# Patient Record
Sex: Male | Born: 1960 | Hispanic: No | Marital: Married | Smoking: Former smoker
Health system: Southern US, Community
[De-identification: ages and names within clinical notes are randomized; demographics above are authoritative.]

## PROBLEM LIST (undated history)

## (undated) DIAGNOSIS — R3912 Poor urinary stream: Secondary | ICD-10-CM

## (undated) DIAGNOSIS — R7302 Impaired glucose tolerance (oral): Secondary | ICD-10-CM

## (undated) DIAGNOSIS — R972 Elevated prostate specific antigen [PSA]: Secondary | ICD-10-CM

## (undated) DIAGNOSIS — N400 Enlarged prostate without lower urinary tract symptoms: Secondary | ICD-10-CM

## (undated) DIAGNOSIS — C61 Malignant neoplasm of prostate: Secondary | ICD-10-CM

## (undated) DIAGNOSIS — N50812 Left testicular pain: Secondary | ICD-10-CM

## (undated) DIAGNOSIS — E291 Testicular hypofunction: Secondary | ICD-10-CM

## (undated) DIAGNOSIS — R17 Unspecified jaundice: Secondary | ICD-10-CM

## (undated) DIAGNOSIS — N529 Male erectile dysfunction, unspecified: Secondary | ICD-10-CM

## (undated) HISTORY — PX: CHOLECYSTECTOMY: SHX55

## (undated) HISTORY — PX: OTHER SURGICAL HISTORY: SHX169

## (undated) HISTORY — DX: Malignant neoplasm of prostate: C61

## (undated) HISTORY — DX: Benign prostatic hyperplasia without lower urinary tract symptoms: N40.0

## (undated) HISTORY — DX: Testicular hypofunction: E29.1

## (undated) HISTORY — DX: Male erectile dysfunction, unspecified: N52.9

## (undated) HISTORY — DX: Left testicular pain: N50.812

## (undated) HISTORY — DX: Poor urinary stream: R39.12

---

## 2000-09-20 HISTORY — PX: GALLBLADDER SURGERY: SHX652

## 2003-04-17 ENCOUNTER — Encounter: Payer: Self-pay | Admitting: Neurosurgery

## 2003-04-17 ENCOUNTER — Ambulatory Visit (HOSPITAL_COMMUNITY): Admission: RE | Admit: 2003-04-17 | Discharge: 2003-04-18 | Payer: Self-pay | Admitting: Neurosurgery

## 2004-09-20 HISTORY — PX: BACK SURGERY: SHX140

## 2004-11-10 ENCOUNTER — Emergency Department: Payer: Self-pay | Admitting: Emergency Medicine

## 2004-11-12 ENCOUNTER — Emergency Department: Payer: Self-pay | Admitting: Emergency Medicine

## 2004-12-30 ENCOUNTER — Other Ambulatory Visit: Payer: Self-pay

## 2004-12-30 ENCOUNTER — Emergency Department: Payer: Self-pay | Admitting: Emergency Medicine

## 2004-12-30 IMAGING — CR DG ABDOMEN 3V
1 series · 4 of 4 positions shown · non-contrast
Comparison: none

REASON FOR EXAM: abd pain  pt in rm 1
COMMENTS:

[Series 1: view not recorded · 0.17mm/px · 4 of 4 slices shown]
[im 1/4]
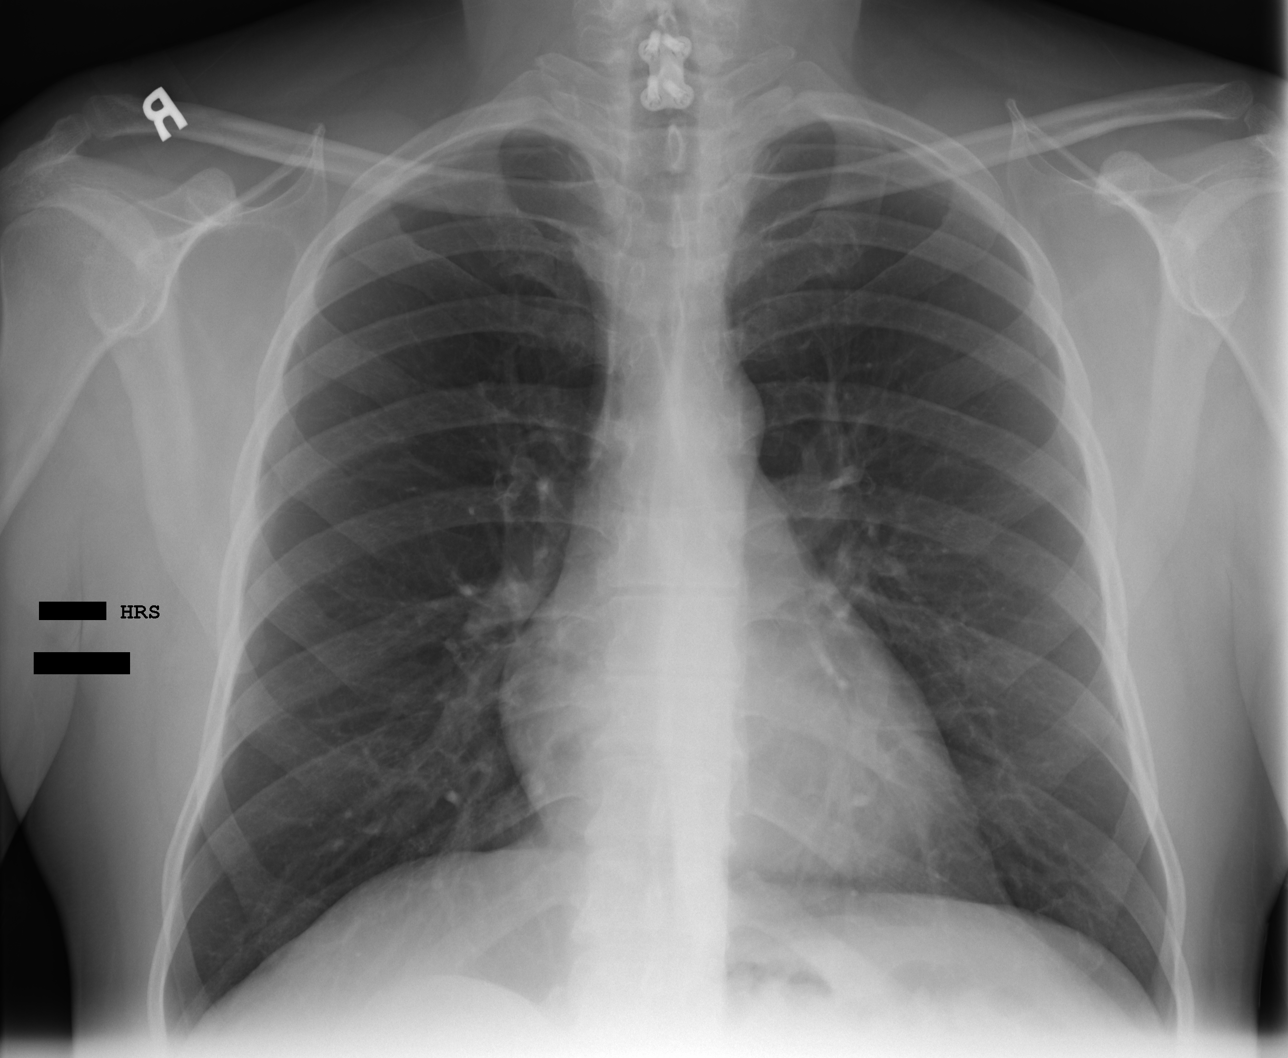
[im 2/4]
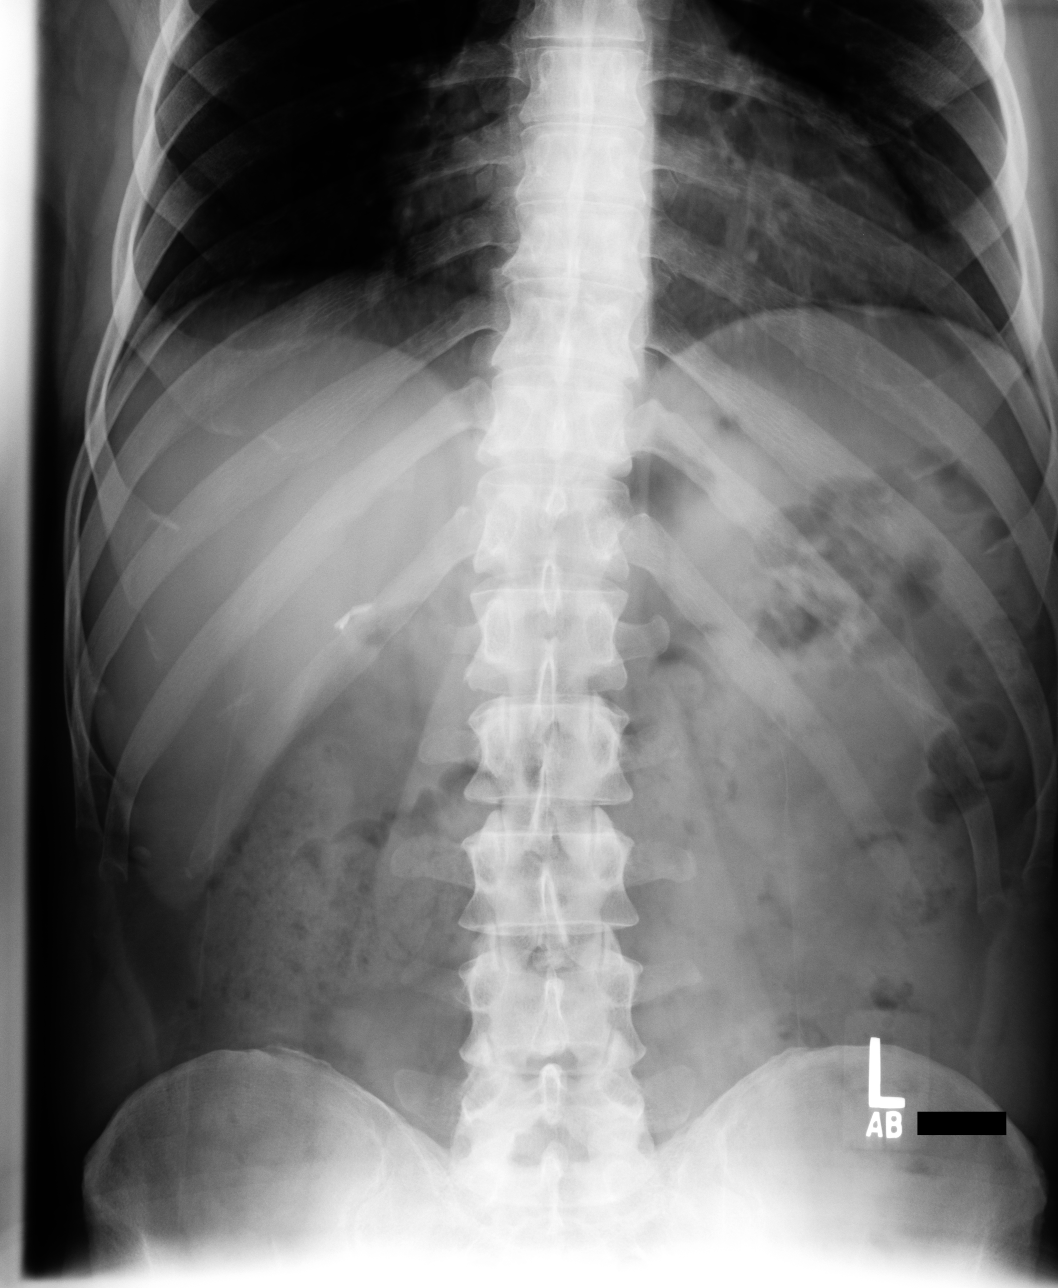
[im 3/4]
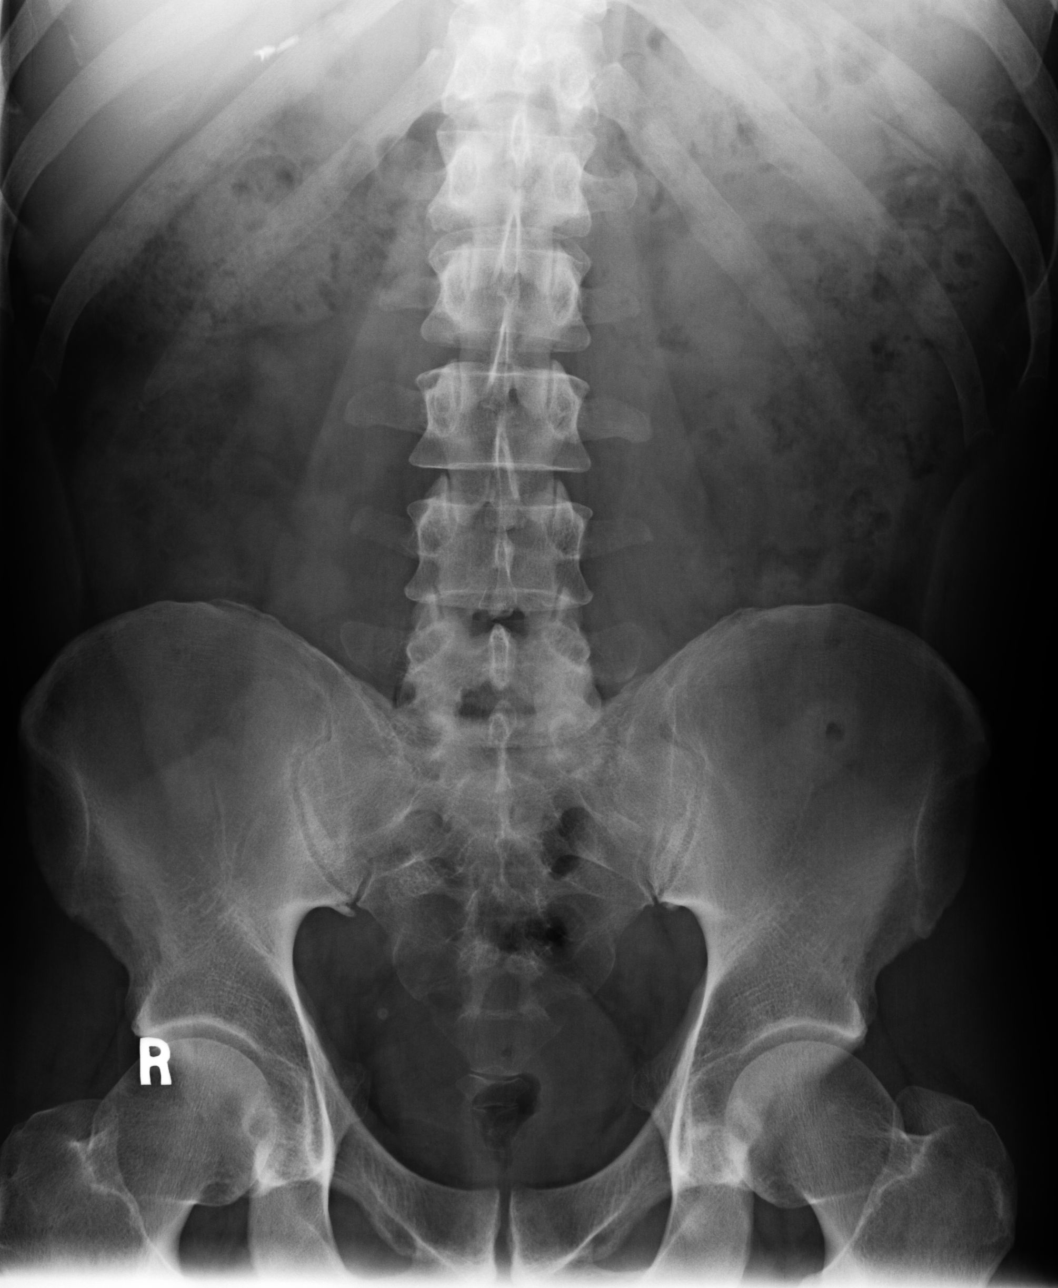
[im 4/4]
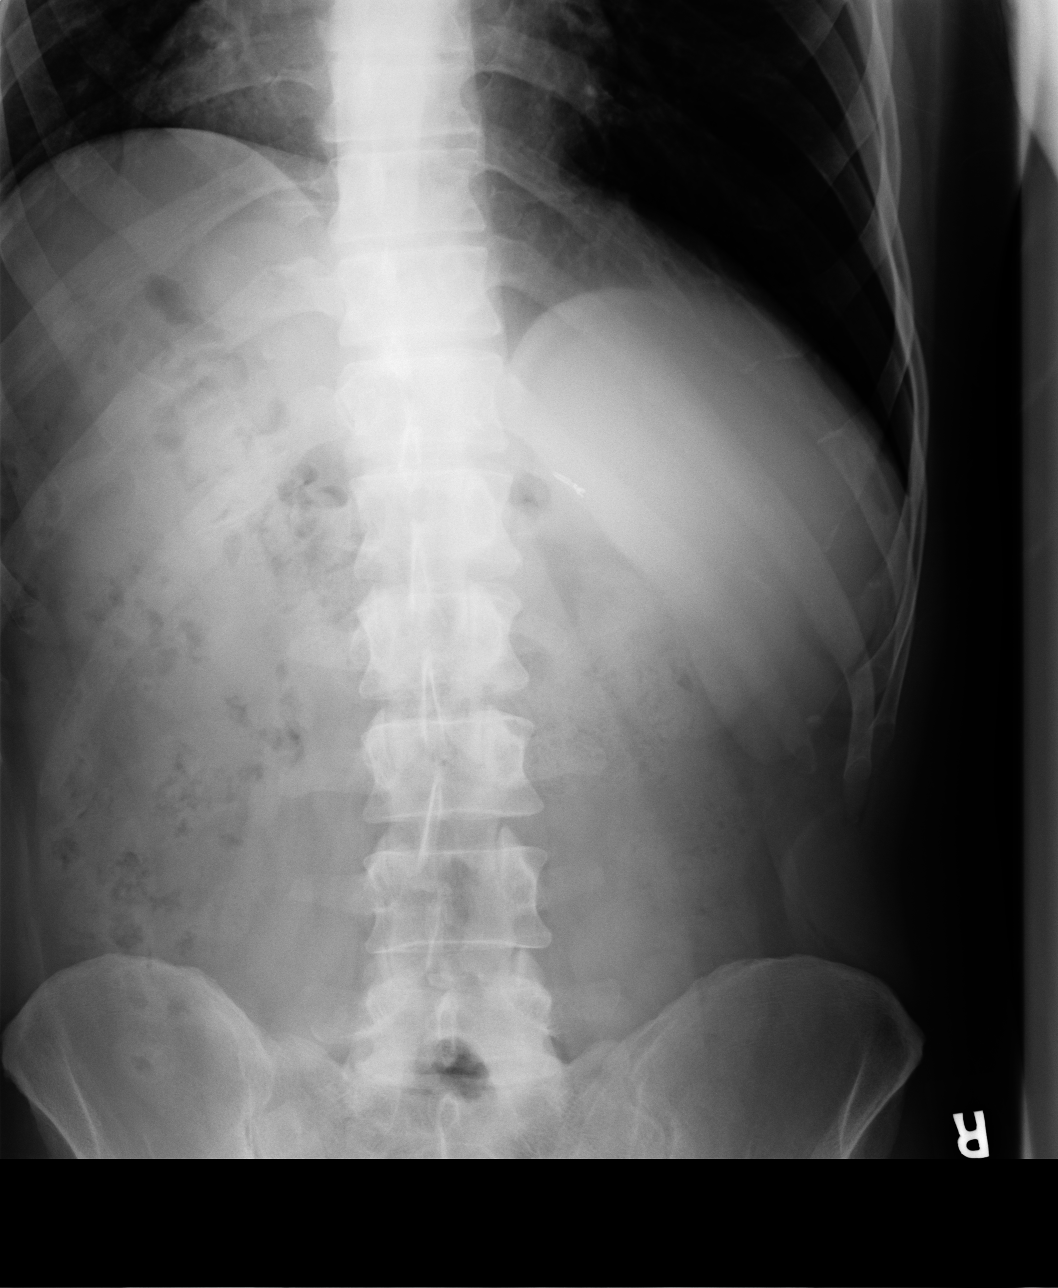

[4 of 4 positions shown; findings below may reference images not displayed]

PROCEDURE:     DXR - DXR ABDOMEN 3-WAY (INCL PA CXR)  - [DATE]  [DATE]

RESULT:        Flat, upright and erect views of the abdomen were obtained.
A PA view of the chest was also performed.  No subdiaphragmatic free air is
seen.  The bowel gas pattern shows no specific abnormalities.  There is no
evidence for bowel obstruction.  No definitely abnormal intra-abdominal
calcifications are seen.  Postoperative metallic clips are noted in the
region of the gallbladder bed.  The osseous structures show no acute
changes.

PA view of the chest shows the lung fields to be clear.  The heart,
mediastinal and osseous structures show no acute changes.
IMPRESSION: No acute changes are identified.

## 2005-01-07 ENCOUNTER — Emergency Department: Payer: Self-pay | Admitting: Emergency Medicine

## 2005-01-15 ENCOUNTER — Inpatient Hospital Stay: Payer: Self-pay | Admitting: Urology

## 2005-01-25 ENCOUNTER — Emergency Department: Payer: Self-pay | Admitting: Emergency Medicine

## 2012-08-01 ENCOUNTER — Emergency Department: Payer: Self-pay | Admitting: Emergency Medicine

## 2012-08-01 LAB — URINALYSIS, COMPLETE
Bacteria: NONE SEEN
Bilirubin,UR: NEGATIVE
Ph: 5 (ref 4.5–8.0)
Squamous Epithelial: NONE SEEN
WBC UR: 3 /HPF (ref 0–5)

## 2012-08-01 LAB — COMPREHENSIVE METABOLIC PANEL
Albumin: 4 g/dL (ref 3.4–5.0)
Anion Gap: 6 — ABNORMAL LOW (ref 7–16)
BUN: 14 mg/dL (ref 7–18)
Bilirubin,Total: 0.9 mg/dL (ref 0.2–1.0)
Chloride: 110 mmol/L — ABNORMAL HIGH (ref 98–107)
Creatinine: 0.69 mg/dL (ref 0.60–1.30)
EGFR (African American): >60
Potassium: 3.7 mmol/L (ref 3.5–5.1)
SGOT(AST): 35 U/L (ref 15–37)
Sodium: 142 mmol/L (ref 136–145)
Total Protein: 7.5 g/dL (ref 6.4–8.2)

## 2012-08-01 LAB — CBC
MCHC: 34.1 g/dL (ref 32.0–36.0)
MCV: 89 fL (ref 80–100)
Platelet: 204 10*3/uL (ref 150–440)
RDW: 13.5 % (ref 11.5–14.5)
WBC: 5.1 10*3/uL (ref 3.8–10.6)

## 2013-11-12 DIAGNOSIS — M25569 Pain in unspecified knee: Secondary | ICD-10-CM | POA: Insufficient documentation

## 2013-11-12 DIAGNOSIS — N5082 Scrotal pain: Secondary | ICD-10-CM | POA: Insufficient documentation

## 2014-08-16 ENCOUNTER — Ambulatory Visit: Payer: Self-pay | Admitting: Urology

## 2015-05-05 ENCOUNTER — Encounter: Payer: Self-pay | Admitting: *Deleted

## 2015-05-12 ENCOUNTER — Encounter: Payer: Self-pay | Admitting: Urology

## 2015-05-12 ENCOUNTER — Ambulatory Visit: Payer: Self-pay | Admitting: Urology

## 2015-12-12 ENCOUNTER — Ambulatory Visit (INDEPENDENT_AMBULATORY_CARE_PROVIDER_SITE_OTHER): Payer: Commercial Managed Care - PPO | Admitting: Urology

## 2015-12-12 VITALS — BP 138/83 | HR 76 | Ht 67.0 in | Wt 184.2 lb

## 2015-12-12 DIAGNOSIS — R972 Elevated prostate specific antigen [PSA]: Secondary | ICD-10-CM | POA: Diagnosis not present

## 2015-12-12 NOTE — Progress Notes (Signed)
12/12/2015 4:25 PM   Dave [name] 05-19-1961 [id number]  Referring provider: No referring provider defined for this encounter.  Chief Complaint  Patient presents with  . Follow-up    elevated PSA    HPI: The patient is a 55 year old gentleman who presents for follow-up of an elevated PSA. Last year his PSA was 4.7, however when it was rechecked it decreased to 2.7. He was lost to follow-up. His PSA was recently rechecked which shows a PSA of 7.2 for which he follows up again today. He also has a complaint ligated as a last appointment that his left testicle ascends during intercourse. His down normally during the day. He also erythematous and erectile dysfunction but was more concern is elevated PSA at this time.    PMH: Past Medical History  Diagnosis Date  . Weak urinary stream   . Testicular pain, left   . Erectile dysfunction   . Hypogonadism in male   . BPH (benign prostatic hyperplasia)     Surgical History: Past Surgical History  Procedure Laterality Date  . Back surgery  2006  . Gallbladder surgery  2002    Home Medications:    Medication List       This list is accurate as of: 12/12/15  4:25 PM.  Always use your most recent med list.               ciclopirox 8 % solution  Commonly known as:  PENLAC  Reported on 12/12/2015     PX RANITIDINE 75 MG tablet  Generic drug:  ranitidine  Take 75 mg by mouth. Reported on 12/12/2015     sildenafil 20 MG tablet  Commonly known as:  REVATIO  Reported on 12/12/2015        Allergies:  Allergies  Allergen Reactions  . Vicodin [Hydrocodone-Acetaminophen]     Family History: Family History  Problem Relation Age of Onset  . Bladder Cancer Neg Hx   . Prostate cancer Father   . Kidney disease Neg Hx     Social History:  reports that he has quit smoking. He does not have any smokeless tobacco history on file. He reports that he drinks alcohol. His drug history is not on file.  ROS:                                          Physical Exam: BP 138/83 mmHg  Pulse 76  Ht 5\' 7"  (1.702 m)  Wt 184 lb 3.2 oz (83.553 kg)  BMI 28.84 kg/m2  Constitutional:  Alert and oriented, No acute distress. HEENT: [place] AT, moist mucus membranes.  Trachea midline, no masses. Cardiovascular: No clubbing, cyanosis, or edema. Respiratory: Normal respiratory effort, no increased work of breathing. GI: Abdomen is soft, nontender, nondistended, no abdominal masses GU: No CVA tenderness. Normal phallus. Testicles descended. Right testicle descended slightly more than the left testicle however they're both in the usual anatomic position. DRE: 2+, smooth with firmness of the right side. Skin: No rashes, bruises or suspicious lesions. Lymph: No cervical or inguinal adenopathy. Neurologic: Grossly intact, no focal deficits, moving all 4 extremities. Psychiatric: Normal mood and affect.  Laboratory Data: Lab Results  Component Value Date   WBC 5.1 08/01/2012   HGB 13.7 08/01/2012   HCT 40.2 08/01/2012   MCV 89 08/01/2012   PLT 204 08/01/2012    Lab Results  Component  Value Date   CREATININE 0.69 08/01/2012    No results found for: PSA  No results found for: TESTOSTERONE  No results found for: HGBA1C  Urinalysis    Component Value Date/Time   COLORURINE Yellow 08/01/2012 1418   APPEARANCEUR Cloudy 08/01/2012 1418   LABSPEC 1.025 08/01/2012 1418   PHURINE 5.0 08/01/2012 1418   GLUCOSEU Negative 08/01/2012 1418   HGBUR 3+ 08/01/2012 1418   BILIRUBINUR Negative 08/01/2012 1418   KETONESUR Trace 08/01/2012 1418   PROTEINUR Negative 08/01/2012 1418   NITRITE Negative 08/01/2012 1418   LEUKOCYTESUR Negative 08/01/2012 1418    Assessment & Plan:    1. Elevated PSA with abnormal DRE Discussed the patient that he is at risk for prostate cancer given his family history, elevated PSA, and abnormal digital rectal exam. We discussed the risks, benefits, and indications of  undergoing a prostate biopsy. He understands the risks include but are not limited to bleeding and infection. He noticed expect blood in stool, C5, and urine. He has a slight chance of infection that would require IV antibiotics in the hospital. All questions were answered. The patient has agreed to proceed.  2. Erectile dysfunction We will address this in greater detail after ruling out prostate cancer.  3. Left ascendant testicle during intercourse I told the patient that his exam is completely normal at this time. If he still has concerns about this after his prostate biopsy, we can address it further.   Return in about 2 weeks (around 12/26/2015).  Nickie Retort, MD  [facility] [address] [place], [place] [address] [phone]

## 2015-12-13 LAB — MICROSCOPIC EXAMINATION: BACTERIA UA: NONE SEEN

## 2015-12-13 LAB — URINALYSIS, COMPLETE
Bilirubin, UA: NEGATIVE
GLUCOSE, UA: NEGATIVE
KETONES UA: NEGATIVE
Leukocytes, UA: NEGATIVE
NITRITE UA: NEGATIVE
Protein, UA: NEGATIVE
Specific Gravity, UA: >1.03 — ABNORMAL HIGH (ref 1.005–1.030)
UUROB: 1 mg/dL (ref 0.2–1.0)
pH, UA: 5.5 (ref 5.0–7.5)

## 2015-12-19 ENCOUNTER — Telehealth: Payer: Self-pay | Admitting: Urology

## 2015-12-19 NOTE — Telephone Encounter (Signed)
Spoke with pt in reference to daughter calling. Pt stated that he would have his daughter call back Monday.

## 2015-12-19 NOTE — Telephone Encounter (Signed)
Pt's daughter called hoping to get the results of his UA that was done when you came to the office. She said that it's ok to leave a message if she doesn't pick up. He doesn't understand English, so she said you can leave the info on her phone. Please advise

## 2016-01-05 ENCOUNTER — Other Ambulatory Visit: Payer: Commercial Managed Care - PPO

## 2016-01-13 ENCOUNTER — Ambulatory Visit: Payer: Commercial Managed Care - PPO

## 2016-01-14 ENCOUNTER — Ambulatory Visit (INDEPENDENT_AMBULATORY_CARE_PROVIDER_SITE_OTHER): Payer: Commercial Managed Care - PPO | Admitting: Urology

## 2016-01-14 ENCOUNTER — Other Ambulatory Visit: Payer: Self-pay | Admitting: Urology

## 2016-01-14 ENCOUNTER — Encounter: Payer: Self-pay | Admitting: Urology

## 2016-01-14 VITALS — BP 133/80 | HR 65 | Ht 67.5 in | Wt 185.8 lb

## 2016-01-14 DIAGNOSIS — R972 Elevated prostate specific antigen [PSA]: Secondary | ICD-10-CM

## 2016-01-14 MED ORDER — GENTAMICIN SULFATE 40 MG/ML IJ SOLN
80.0000 mg | Freq: Once | INTRAMUSCULAR | Status: AC
  Administered 2016-01-14: 80 mg via INTRAMUSCULAR

## 2016-01-14 MED ORDER — LEVOFLOXACIN 500 MG PO TABS
500.0000 mg | ORAL_TABLET | Freq: Once | ORAL | Status: AC
  Administered 2016-01-14: 500 mg via ORAL

## 2016-01-14 NOTE — Progress Notes (Signed)
01/14/2016  CC: elevated PSA  HPI: A 55 year old Spanish-speaking male with elevated PSA to 7.2.  Rectal exam shows an enlarged prostate, with firmness on the right side. He returns to the office today for prostate biopsy.   Prostate Biopsy Procedure   Informed consent was obtained after discussing risks/benefits of the procedure.  A time out was performed to ensure correct patient identity.  Pre-Procedure: - Last PSA Level: 7.2 - Gentamicin given prophylactically - Levaquin 500 mg administered PO -Transrectal Ultrasound performed revealing a 80 gm prostate  -No significant hypoechoic lesions noted -Small median lobe appreciated  Procedure: - Prostate block performed using 10 cc 1% lidocaine and biopsies taken from sextant areas, a total of 12 under ultrasound guidance.  Post-Procedure: - Patient tolerated the procedure well - He was counseled to seek immediate medical attention if experiences any severe pain, significant bleeding, or fevers - Return in one week to discuss biopsy results  Assessment/ Plan:  1. Elevated PSA S/p uncomplicated biopsy Warning symptoms reviewed Follow up in 1 week for pathology results - levofloxacin (LEVAQUIN) tablet 500 mg; Take 1 tablet (500 mg total) by mouth once. - gentamicin (GARAMYCIN) injection 80 mg; Inject 2 mLs (80 mg total) into the muscle once.

## 2016-01-20 LAB — PATHOLOGY REPORT

## 2016-01-22 ENCOUNTER — Encounter: Payer: Self-pay | Admitting: Urology

## 2016-01-22 ENCOUNTER — Other Ambulatory Visit: Payer: Self-pay | Admitting: Urology

## 2016-01-22 ENCOUNTER — Ambulatory Visit (INDEPENDENT_AMBULATORY_CARE_PROVIDER_SITE_OTHER): Payer: Commercial Managed Care - PPO | Admitting: Urology

## 2016-01-22 VITALS — BP 120/72 | HR 76 | Ht 69.0 in | Wt 186.4 lb

## 2016-01-22 DIAGNOSIS — R972 Elevated prostate specific antigen [PSA]: Secondary | ICD-10-CM

## 2016-01-22 NOTE — Progress Notes (Signed)
01/22/2016 4:14 PM   Bryan Mcneil 11-Nov-1960 [id number]  Referring provider: Juluis Pitch, MD [address] [place], [place] [address]  Chief Complaint  Patient presents with  . Follow-up    biopsy results    HPI: The patient is a 55 year old gentleman who presents today for his prostate biopsy results. His prebiopsy PSA was 7.2 and he had firmness on the right side of his prostate on physical exam. Fortunately, his prostate biopsy was negative except for signs of focal acute and chronic inflammation which may explain his elevated PSA.   PMH: Past Medical History  Diagnosis Date  . Weak urinary stream   . Testicular pain, left   . Erectile dysfunction   . Hypogonadism in male   . BPH (benign prostatic hyperplasia)     Surgical History: Past Surgical History  Procedure Laterality Date  . Back surgery  2006  . Gallbladder surgery  2002    Home Medications:    Medication List       This list is accurate as of: 01/22/16  4:14 PM.  Always use your most recent med list.               ciclopirox 8 % solution  Commonly known as:  PENLAC  Reported on 01/22/2016     PX RANITIDINE 75 MG tablet  Generic drug:  ranitidine  Take 75 mg by mouth. Reported on 01/22/2016     sildenafil 20 MG tablet  Commonly known as:  REVATIO  Reported on 01/22/2016        Allergies:  Allergies  Allergen Reactions  . Vicodin [Hydrocodone-Acetaminophen]     Family History: Family History  Problem Relation Age of Onset  . Bladder Cancer Neg Hx   . Prostate cancer Father   . Kidney disease Neg Hx     Social History:  reports that he quit smoking about 6 years ago. He does not have any smokeless tobacco history on file. He reports that he drinks alcohol. He reports that he does not use illicit drugs.  ROS:                                        Physical Exam: BP 120/72 mmHg  Pulse 76  Ht 5\' 9"  (1.753 m)  Wt 186 lb 6.4 oz  (84.55 kg)  BMI 27.51 kg/m2  Constitutional:  Alert and oriented, No acute distress. HEENT: [place] AT, moist mucus membranes.  Trachea midline, no masses. Cardiovascular: No clubbing, cyanosis, or edema. Respiratory: Normal respiratory effort, no increased work of breathing. GI: Abdomen is soft, nontender, nondistended, no abdominal masses GU: No CVA tenderness.  Skin: No rashes, bruises or suspicious lesions. Lymph: No cervical or inguinal adenopathy. Neurologic: Grossly intact, no focal deficits, moving all 4 extremities. Psychiatric: Normal mood and affect.  Laboratory Data: Lab Results  Component Value Date   WBC 5.1 08/01/2012   HGB 13.7 08/01/2012   HCT 40.2 08/01/2012   MCV 89 08/01/2012   PLT 204 08/01/2012    Lab Results  Component Value Date   CREATININE 0.69 08/01/2012    No results found for: PSA  No results found for: TESTOSTERONE  No results found for: HGBA1C  Urinalysis    Component Value Date/Time   COLORURINE Yellow 08/01/2012 1418   APPEARANCEUR Clear 12/12/2015 1636   APPEARANCEUR Cloudy 08/01/2012 1418   LABSPEC 1.025 08/01/2012  [place] 5.0 08/01/2012 1418   GLUCOSEU Negative 12/12/2015 1636   GLUCOSEU Negative 08/01/2012 1418   HGBUR 3+ 08/01/2012 1418   BILIRUBINUR Negative 12/12/2015 1636   BILIRUBINUR Negative 08/01/2012 [place] 08/01/2012 1418   PROTEINUR Negative 12/12/2015 1636   PROTEINUR Negative 08/01/2012 1418   NITRITE Negative 12/12/2015 1636   NITRITE Negative 08/01/2012 1418   LEUKOCYTESUR Negative 12/12/2015 1636   LEUKOCYTESUR Negative 08/01/2012 1418      Assessment & Plan:   1. Elevated PSA / firmness right side of prostate with negative prostate biopsy -Follow up in 6 months for DRE with PSA drawn one week.  Return in about 6 months (around 07/24/2016) for with psa one week prior.  Nickie Retort, MD  [facility] [address] [place], [place]  [address] [phone]

## 2016-07-23 ENCOUNTER — Other Ambulatory Visit: Payer: Self-pay | Admitting: Family Medicine

## 2016-07-23 ENCOUNTER — Other Ambulatory Visit: Payer: Commercial Managed Care - PPO

## 2016-07-23 DIAGNOSIS — R972 Elevated prostate specific antigen [PSA]: Secondary | ICD-10-CM

## 2016-07-24 LAB — PSA TOTAL (REFLEX TO FREE): Prostate Specific Ag, Serum: 5.8 ng/mL — ABNORMAL HIGH (ref 0.0–4.0)

## 2016-07-24 LAB — FPSA% REFLEX
% FREE PSA: 22.2 %
PSA, FREE: 1.29 ng/mL

## 2016-07-29 ENCOUNTER — Encounter: Payer: Self-pay | Admitting: Urology

## 2016-07-29 ENCOUNTER — Ambulatory Visit (INDEPENDENT_AMBULATORY_CARE_PROVIDER_SITE_OTHER): Payer: Commercial Managed Care - PPO | Admitting: Urology

## 2016-07-29 VITALS — BP 120/70 | HR 78 | Ht 67.0 in | Wt 181.5 lb

## 2016-07-29 DIAGNOSIS — R972 Elevated prostate specific antigen [PSA]: Secondary | ICD-10-CM | POA: Diagnosis not present

## 2016-07-29 NOTE — Progress Notes (Signed)
07/29/2016 11:25 AM   Bryan Mcneil 02-19-1961 [id number]  Referring provider: Juluis Pitch, MD [phone] [place], [place] [address]  Chief Complaint  Patient presents with  . Elevated PSA    HPI: The patient is a 55 year old gentleman who presents for follow-up of an elevated PSA. He underwent a prostate biopsy in April 2017 that was negative for malignancy but did show acute and chronic inflammation. His PSA at that time was 7.2. He was rechecked in November 2017 was 5.8 with a free PSA percentage of 22.2. This puts him at a 10% risk of prostate cancer.    The patient also is having problems with erectile dysfunction. He is not able to maintain erections suitable to complete intercourse. His primary care provider prescribed Viagra but it was too expensive. He is interested in other medications that may be cheaper.   PMH: Past Medical History:  Diagnosis Date  . BPH (benign prostatic hyperplasia)   . Erectile dysfunction   . Hypogonadism in male   . Testicular pain, left   . Weak urinary stream     Surgical History: Past Surgical History:  Procedure Laterality Date  . BACK SURGERY  2006  . GALLBLADDER SURGERY  2002    Home Medications:    Medication List       Accurate as of 07/29/16 11:25 AM. Always use your most recent med list.          sildenafil 20 MG tablet Commonly known as:  REVATIO Take 1-5 tabs as needed       Allergies:  Allergies  Allergen Reactions  . Vicodin [Hydrocodone-Acetaminophen]     Family History: Family History  Problem Relation Age of Onset  . Bladder Cancer Neg Hx   . Prostate cancer Father   . Kidney disease Neg Hx     Social History:  reports that he quit smoking about 6 years ago. He does not have any smokeless tobacco history on file. He reports that he drinks alcohol. He reports that he does not use drugs.  ROS: UROLOGY Frequent Urination?: No Hard to postpone urination?: No Burning/pain with  urination?: No Get up at night to urinate?: No Leakage of urine?: No Urine stream starts and stops?: No Trouble starting stream?: No Do you have to strain to urinate?: No Blood in urine?: No Urinary tract infection?: No Sexually transmitted disease?: No Injury to kidneys or bladder?: No Painful intercourse?: No Weak stream?: No Erection problems?: Yes Penile pain?: No  Gastrointestinal Nausea?: No Vomiting?: No Indigestion/heartburn?: No Diarrhea?: No Constipation?: No  Constitutional Fever: No Night sweats?: No Weight loss?: No Fatigue?: No  Skin Skin rash/lesions?: No Itching?: No  Eyes Blurred vision?: No Double vision?: No  Ears/Nose/Throat Sore throat?: No Sinus problems?: No  Hematologic/Lymphatic Swollen glands?: No Easy bruising?: No  Cardiovascular Leg swelling?: No Chest pain?: No  Respiratory Cough?: No Shortness of breath?: No  Endocrine Excessive thirst?: No  Musculoskeletal Back pain?: No Joint pain?: No  Neurological Headaches?: No Dizziness?: No  Psychologic Depression?: No Anxiety?: No  Physical Exam: BP 120/70   Pulse 78   Ht 5\' 7"  (1.702 m)   Wt 181 lb 8 oz (82.3 kg)   BMI 28.43 kg/m   Constitutional:  Alert and oriented, No acute distress. HEENT:  AT, moist mucus membranes.  Trachea midline, no masses. Cardiovascular: No clubbing, cyanosis, or edema. Respiratory: Normal respiratory effort, no increased work of breathing. GI: Abdomen is soft, nontender, nondistended, no abdominal masses GU:  No CVA tenderness.  Skin: No rashes, bruises or suspicious lesions. Lymph: No cervical or inguinal adenopathy. Neurologic: Grossly intact, no focal deficits, moving all 4 extremities. Psychiatric: Normal mood and affect.  Laboratory Data: Lab Results  Component Value Date   WBC 5.1 08/01/2012   HGB 13.7 08/01/2012   HCT 40.2 08/01/2012   MCV 89 08/01/2012   PLT 204 08/01/2012    Lab Results  Component Value Date    CREATININE 0.69 08/01/2012    No results found for: PSA  No results found for: TESTOSTERONE  No results found for: HGBA1C  Urinalysis    Component Value Date/Time   COLORURINE Yellow 08/01/2012 1418   APPEARANCEUR Clear 12/12/2015 1636   LABSPEC 1.025 08/01/2012 1418   PHURINE 5.0 08/01/2012 1418   GLUCOSEU Negative 12/12/2015 1636   GLUCOSEU Negative 08/01/2012 1418   HGBUR 3+ 08/01/2012 1418   BILIRUBINUR Negative 12/12/2015 1636   BILIRUBINUR Negative 08/01/2012 1418   KETONESUR Trace 08/01/2012 1418   PROTEINUR Negative 12/12/2015 1636   PROTEINUR Negative 08/01/2012 1418   NITRITE Negative 12/12/2015 1636   NITRITE Negative 08/01/2012 1418   LEUKOCYTESUR Negative 12/12/2015 1636   LEUKOCYTESUR Negative 08/01/2012 1418      Assessment & Plan:    1. Elevated PSA His PSA has trended down since his last PSA and negative biopsy. We will continue to monitor his PSA on a 6 month basis. He will follow-up in 6 months with a PSA prior.  2. Erectile dysfunction The patient was given a prescription for generic sildenafil 20 mg. He is instructed to take 1-5 tablets by mouth daily as needed. He was warned the risk of priapism and the need for emergent intervention. He was given the name of a pharmacy where he'll be able to fill the prescription for a $1 per pill.  Return in about 6 months (around 01/26/2017) for psa prior.  Nickie Retort, MD  [facility] [address], [place], [place] [address] [phone]

## 2016-11-19 DIAGNOSIS — M2550 Pain in unspecified joint: Secondary | ICD-10-CM | POA: Diagnosis not present

## 2016-11-19 DIAGNOSIS — M5030 Other cervical disc degeneration, unspecified cervical region: Secondary | ICD-10-CM | POA: Diagnosis not present

## 2016-11-19 DIAGNOSIS — M25511 Pain in right shoulder: Secondary | ICD-10-CM | POA: Diagnosis not present

## 2017-01-20 ENCOUNTER — Other Ambulatory Visit: Payer: Commercial Managed Care - PPO

## 2017-01-20 DIAGNOSIS — R972 Elevated prostate specific antigen [PSA]: Secondary | ICD-10-CM | POA: Diagnosis not present

## 2017-01-21 LAB — PSA TOTAL (REFLEX TO FREE): Prostate Specific Ag, Serum: 5.5 ng/mL — ABNORMAL HIGH (ref 0.0–4.0)

## 2017-01-21 LAB — FPSA% REFLEX
% FREE PSA: 19.6 %
PSA, FREE: 1.08 ng/mL

## 2017-01-27 ENCOUNTER — Encounter: Payer: Self-pay | Admitting: Urology

## 2017-01-27 ENCOUNTER — Ambulatory Visit: Payer: Commercial Managed Care - PPO | Admitting: Urology

## 2017-01-27 VITALS — BP 130/73 | HR 75 | Ht 67.0 in | Wt 185.4 lb

## 2017-01-27 DIAGNOSIS — N529 Male erectile dysfunction, unspecified: Secondary | ICD-10-CM | POA: Diagnosis not present

## 2017-01-27 DIAGNOSIS — R972 Elevated prostate specific antigen [PSA]: Secondary | ICD-10-CM

## 2017-01-27 NOTE — Progress Notes (Signed)
01/27/2017 10:34 AM   Bryan Mcneil 05-Jan-1961 [id number]  Referring provider: Juluis Pitch, MD [phone] [place], [place] [address]  Chief Complaint  Patient presents with  . Follow-up    HPI: The patient is a 56 year old gentleman presents today for follow-up of his elevated PSA and erectile dysfunction.  1. Elevated PSA He underwent a prostate biopsy in April 2017 that was negative for malignancy but did show acute and chronic inflammation. His PSA at that time was 7.2.  His DRE at that time was also positive for a right sided firmness compared to the left. He was rechecked in November 2017 was 5.8 with a free PSA percentage of 22.2. This puts him at a 10% risk of prostate cancer.  His PSA in May 2018 was 5.5. His free PSA percentage was 19.6.  2. Erectile dysfunction The patient is on generic sildenafil at this time. He does not use the medication every time. He does note a slight headache when he takes it. He is not interested in trying another medication as as other phosphodiesterase inhibitotors have been cost prohibitive.  3. BPH Denies nocturia. He reports good stream. Feels that he empties his bladder. No dysuria. Does have some urgency which he does not find very bothersome.  PMH: Past Medical History:  Diagnosis Date  . BPH (benign prostatic hyperplasia)   . Erectile dysfunction   . Hypogonadism in male   . Testicular pain, left   . Weak urinary stream     Surgical History: Past Surgical History:  Procedure Laterality Date  . BACK SURGERY  2006  . GALLBLADDER SURGERY  2002    Home Medications:  Allergies as of 01/27/2017      Reactions   Vicodin [hydrocodone-acetaminophen]       Medication List       Accurate as of 01/27/17 10:34 AM. Always use your most recent med list.          sildenafil 20 MG tablet Commonly known as:  REVATIO Take 1-5 tabs as needed       Allergies:  Allergies  Allergen Reactions  . Vicodin  [Hydrocodone-Acetaminophen]     Family History: Family History  Problem Relation Age of Onset  . Prostate cancer Father   . Bladder Cancer Neg Hx   . Kidney disease Neg Hx     Social History:  reports that he quit smoking about 7 years ago. He has never used smokeless tobacco. He reports that he drinks alcohol. He reports that he does not use drugs.  ROS: UROLOGY Frequent Urination?: No Hard to postpone urination?: No Burning/pain with urination?: No Get up at night to urinate?: No Leakage of urine?: No Urine stream starts and stops?: No Trouble starting stream?: No Do you have to strain to urinate?: No Blood in urine?: No Urinary tract infection?: No Sexually transmitted disease?: No Injury to kidneys or bladder?: No Painful intercourse?: No Weak stream?: No Erection problems?: No Penile pain?: No  Gastrointestinal Nausea?: No Vomiting?: No Indigestion/heartburn?: No Diarrhea?: No Constipation?: No  Constitutional Fever: No Night sweats?: No Weight loss?: No Fatigue?: No  Skin Skin rash/lesions?: No Itching?: No  Eyes Blurred vision?: No Double vision?: No  Ears/Nose/Throat Sore throat?: No Sinus problems?: No  Hematologic/Lymphatic Swollen glands?: No Easy bruising?: No  Cardiovascular Leg swelling?: No Chest pain?: No  Respiratory Cough?: No Shortness of breath?: No  Endocrine Excessive thirst?: No  Musculoskeletal Back pain?: No Joint pain?: Yes  Neurological Headaches?: No Dizziness?:  No  Psychologic Depression?: No Anxiety?: No  Physical Exam: BP 130/73 (BP Location: Left Arm, Patient Position: Sitting, Cuff Size: Large)   Pulse 75   Ht 5\' 7"  (1.702 m)   Wt 185 lb 6.4 oz (84.1 kg)   BMI 29.04 kg/m   Constitutional:  Alert and oriented, No acute distress. HEENT: [place] AT, moist mucus membranes.  Trachea midline, no masses. Cardiovascular: No clubbing, cyanosis, or edema. Respiratory: Normal respiratory effort, no  increased work of breathing. GI: Abdomen is soft, nontender, nondistended, no abdominal masses GU: No CVA tenderness. Patient refuses DRE. He would like to defer until his next appointment. Skin: No rashes, bruises or suspicious lesions. Lymph: No cervical or inguinal adenopathy. Neurologic: Grossly intact, no focal deficits, moving all 4 extremities. Psychiatric: Normal mood and affect.  Laboratory Data: Lab Results  Component Value Date   WBC 5.1 08/01/2012   HGB 13.7 08/01/2012   HCT 40.2 08/01/2012   MCV 89 08/01/2012   PLT 204 08/01/2012    Lab Results  Component Value Date   CREATININE 0.69 08/01/2012    No results found for: PSA  No results found for: TESTOSTERONE  No results found for: HGBA1C  Urinalysis    Component Value Date/Time   COLORURINE Yellow 08/01/2012 1418   APPEARANCEUR Clear 12/12/2015 1636   LABSPEC 1.025 08/01/2012 1418   PHURINE 5.0 08/01/2012 1418   GLUCOSEU Negative 12/12/2015 1636   GLUCOSEU Negative 08/01/2012 1418   HGBUR 3+ 08/01/2012 1418   BILIRUBINUR Negative 12/12/2015 1636   BILIRUBINUR Negative 08/01/2012 1418   KETONESUR Trace 08/01/2012 1418   PROTEINUR Negative 12/12/2015 1636   PROTEINUR Negative 08/01/2012 1418   NITRITE Negative 12/12/2015 1636   NITRITE Negative 08/01/2012 1418   LEUKOCYTESUR Negative 12/12/2015 1636   LEUKOCYTESUR Negative 08/01/2012 1418     Assessment & Plan:    1. Elevated PSA Repeat PSA in 6 months as is currently stable. Per patient's request, we will defer DRE until his next visit.  2. Erectile dysfunction Continue sildenafil as needed  3. BPH Asymptomatic. No treatment necessary  Return in about 6 months (around 07/30/2017) for PSA prior.  Nickie Retort, MD  [facility] [address], [place], [place] [address] [phone]

## 2017-05-03 DIAGNOSIS — R319 Hematuria, unspecified: Secondary | ICD-10-CM | POA: Diagnosis not present

## 2017-05-03 DIAGNOSIS — R109 Unspecified abdominal pain: Secondary | ICD-10-CM | POA: Diagnosis not present

## 2017-05-03 DIAGNOSIS — Z87898 Personal history of other specified conditions: Secondary | ICD-10-CM | POA: Diagnosis not present

## 2017-06-08 DIAGNOSIS — R109 Unspecified abdominal pain: Secondary | ICD-10-CM | POA: Diagnosis not present

## 2017-06-08 DIAGNOSIS — M545 Low back pain: Secondary | ICD-10-CM | POA: Diagnosis not present

## 2017-06-22 DIAGNOSIS — L280 Lichen simplex chronicus: Secondary | ICD-10-CM | POA: Diagnosis not present

## 2017-07-08 DIAGNOSIS — N529 Male erectile dysfunction, unspecified: Secondary | ICD-10-CM | POA: Insufficient documentation

## 2017-07-08 DIAGNOSIS — N400 Enlarged prostate without lower urinary tract symptoms: Secondary | ICD-10-CM | POA: Diagnosis not present

## 2017-07-08 DIAGNOSIS — Z0000 Encounter for general adult medical examination without abnormal findings: Secondary | ICD-10-CM | POA: Diagnosis not present

## 2017-07-08 DIAGNOSIS — R972 Elevated prostate specific antigen [PSA]: Secondary | ICD-10-CM | POA: Insufficient documentation

## 2017-07-15 DIAGNOSIS — Z13220 Encounter for screening for lipoid disorders: Secondary | ICD-10-CM | POA: Diagnosis not present

## 2017-07-15 DIAGNOSIS — Z0000 Encounter for general adult medical examination without abnormal findings: Secondary | ICD-10-CM | POA: Diagnosis not present

## 2017-07-15 DIAGNOSIS — G8929 Other chronic pain: Secondary | ICD-10-CM | POA: Diagnosis not present

## 2017-07-15 DIAGNOSIS — Z79899 Other long term (current) drug therapy: Secondary | ICD-10-CM | POA: Diagnosis not present

## 2017-07-15 DIAGNOSIS — M25512 Pain in left shoulder: Secondary | ICD-10-CM | POA: Diagnosis not present

## 2017-07-15 DIAGNOSIS — M25511 Pain in right shoulder: Secondary | ICD-10-CM | POA: Diagnosis not present

## 2017-07-15 DIAGNOSIS — M7542 Impingement syndrome of left shoulder: Secondary | ICD-10-CM | POA: Diagnosis not present

## 2017-07-15 DIAGNOSIS — Z131 Encounter for screening for diabetes mellitus: Secondary | ICD-10-CM | POA: Diagnosis not present

## 2017-07-15 DIAGNOSIS — Z1159 Encounter for screening for other viral diseases: Secondary | ICD-10-CM | POA: Diagnosis not present

## 2017-07-15 DIAGNOSIS — M7541 Impingement syndrome of right shoulder: Secondary | ICD-10-CM | POA: Diagnosis not present

## 2017-07-18 DIAGNOSIS — R7302 Impaired glucose tolerance (oral): Secondary | ICD-10-CM | POA: Insufficient documentation

## 2017-07-27 ENCOUNTER — Other Ambulatory Visit: Payer: Commercial Managed Care - PPO

## 2017-07-27 DIAGNOSIS — R972 Elevated prostate specific antigen [PSA]: Secondary | ICD-10-CM | POA: Diagnosis not present

## 2017-07-28 LAB — FPSA% REFLEX
% FREE PSA: 18.6 %
PSA, FREE: 1.32 ng/mL

## 2017-07-28 LAB — PSA TOTAL (REFLEX TO FREE): PROSTATE SPECIFIC AG, SERUM: 7.1 ng/mL — AB (ref 0.0–4.0)

## 2017-07-29 ENCOUNTER — Encounter: Payer: Self-pay | Admitting: Urology

## 2017-07-29 ENCOUNTER — Ambulatory Visit: Payer: Commercial Managed Care - PPO | Admitting: Urology

## 2017-07-29 VITALS — BP 138/74 | HR 71 | Ht 68.0 in | Wt 181.0 lb

## 2017-07-29 DIAGNOSIS — N400 Enlarged prostate without lower urinary tract symptoms: Secondary | ICD-10-CM

## 2017-07-29 DIAGNOSIS — N529 Male erectile dysfunction, unspecified: Secondary | ICD-10-CM

## 2017-07-29 DIAGNOSIS — R972 Elevated prostate specific antigen [PSA]: Secondary | ICD-10-CM

## 2017-07-29 NOTE — Progress Notes (Signed)
07/29/2017 2:28 PM   Bryan Mcneil Sep 18, 1961 [id number]  Referring provider: Juluis Pitch, MD [phone] [place], [place] [address]  Chief Complaint  Patient presents with  . Follow-up    HPI: The patient is a 56 year old gentleman presents today for follow-up of his elevated PSA and erectile dysfunction.  1. Elevated PSA He underwent a prostate biopsy in April 2017 that was negative for malignancy but did show acute and chronic inflammation. His PSA at that time was 7.2.  His DRE at that time was also positive for a right sided firmness compared to the left. He was rechecked in November 2017 was 5.8 with a free PSA percentage free of 22.2. This puts him at a 10% risk of prostate cancer. His PSA in May 2018 was 5.5. His free PSA percentage was 19.6. PSA  In November 2018 was 7.1 with percent free of 18.6%.  2. Erectile dysfunction The patient is on generic sildenafil at this time. He does not use the medication every time. He does note a slight headache when he takes it. He is not interested in trying another medication as other phosphodiesterase inhibitotors have been cost prohibitive.  3. BPH Denies nocturia. He reports good stream. Feels that he empties his bladder. No dysuria. Does have some urgency which he does not find very bothersome.      PMH: Past Medical History:  Diagnosis Date  . BPH (benign prostatic hyperplasia)   . Erectile dysfunction   . Hypogonadism in male   . Testicular pain, left   . Weak urinary stream     Surgical History: Past Surgical History:  Procedure Laterality Date  . BACK SURGERY  2006  . GALLBLADDER SURGERY  2002    Home Medications:  Allergies as of 07/29/2017      Reactions   Vicodin [hydrocodone-acetaminophen]       Medication List    as of 07/29/2017  2:28 PM   You have not been prescribed any medications.     Allergies:  Allergies  Allergen Reactions  . Vicodin [Hydrocodone-Acetaminophen]      Family History: Family History  Problem Relation Age of Onset  . Prostate cancer Father   . Bladder Cancer Neg Hx   . Kidney disease Neg Hx     Social History:  reports that he quit smoking about 7 years ago. he has never used smokeless tobacco. He reports that he drinks alcohol. He reports that he does not use drugs.  ROS: UROLOGY Frequent Urination?: No Hard to postpone urination?: No Burning/pain with urination?: No Get up at night to urinate?: No Leakage of urine?: No Urine stream starts and stops?: No Trouble starting stream?: No Do you have to strain to urinate?: No Blood in urine?: No Urinary tract infection?: No Sexually transmitted disease?: No Injury to kidneys or bladder?: No Painful intercourse?: No Weak stream?: No Erection problems?: No Penile pain?: No  Gastrointestinal Nausea?: No Vomiting?: No Indigestion/heartburn?: No Diarrhea?: No Constipation?: No  Constitutional Fever: No Night sweats?: No Weight loss?: No Fatigue?: No  Skin Skin rash/lesions?: No Itching?: No  Eyes Blurred vision?: No Double vision?: No  Ears/Nose/Throat Sore throat?: No Sinus problems?: No  Hematologic/Lymphatic Swollen glands?: No Easy bruising?: No  Cardiovascular Leg swelling?: No Chest pain?: No  Respiratory Cough?: No Shortness of breath?: No  Endocrine Excessive thirst?: No  Musculoskeletal Back pain?: No Joint pain?: No  Neurological Headaches?: No Dizziness?: No  Psychologic Depression?: No Anxiety?: No  Physical Exam: BP  138/74   Pulse 71   Ht 5\' 8"  (1.727 m)   Wt 181 lb (82.1 kg)   BMI 27.52 kg/m   Constitutional:  Alert and oriented, No acute distress. HEENT: [place] AT, moist mucus membranes.  Trachea midline, no masses. Cardiovascular: No clubbing, cyanosis, or edema. Respiratory: Normal respiratory effort, no increased work of breathing. GI: Abdomen is soft, nontender, nondistended, no abdominal masses GU: No CVA  tenderness.  DRE: 2+ right slightly more indurated than the left.  This was how his exam was on his previous DRE though.  It is stable. Skin: No rashes, bruises or suspicious lesions. Lymph: No cervical or inguinal adenopathy. Neurologic: Grossly intact, no focal deficits, moving all 4 extremities. Psychiatric: Normal mood and affect.  Laboratory Data: Lab Results  Component Value Date   WBC 5.1 08/01/2012   HGB 13.7 08/01/2012   HCT 40.2 08/01/2012   MCV 89 08/01/2012   PLT 204 08/01/2012    Lab Results  Component Value Date   CREATININE 0.69 08/01/2012    No results found for: PSA  No results found for: TESTOSTERONE  No results found for: HGBA1C  Urinalysis    Component Value Date/Time   COLORURINE Yellow 08/01/2012 1418   APPEARANCEUR Clear 12/12/2015 1636   LABSPEC 1.025 08/01/2012 1418   PHURINE 5.0 08/01/2012 1418   GLUCOSEU Negative 12/12/2015 1636   GLUCOSEU Negative 08/01/2012 1418   HGBUR 3+ 08/01/2012 1418   BILIRUBINUR Negative 12/12/2015 1636   BILIRUBINUR Negative 08/01/2012 1418   KETONESUR Trace 08/01/2012 1418   PROTEINUR Negative 12/12/2015 1636   PROTEINUR Negative 08/01/2012 1418   NITRITE Negative 12/12/2015 1636   NITRITE Negative 08/01/2012 1418   LEUKOCYTESUR Negative 12/12/2015 1636   LEUKOCYTESUR Negative 08/01/2012 1418    Assessment & Plan:   1. Elevated PSA Repeat PSA in 6 months.  PSA is slightly up from when it was last checked, however it is still less than it was at the time of his negative prostate biopsy.    2. Erectile dysfunction Continue sildenafil as needed  3. BPH Asymptomatic. No treatment necessary  Return in about 6 months (around 01/26/2018) for PSA prior.  Nickie Retort, MD  [facility] [address], [place],  [address] [phone]

## 2017-08-08 DIAGNOSIS — M7541 Impingement syndrome of right shoulder: Secondary | ICD-10-CM | POA: Diagnosis not present

## 2017-08-08 DIAGNOSIS — M7542 Impingement syndrome of left shoulder: Secondary | ICD-10-CM | POA: Diagnosis not present

## 2017-08-08 DIAGNOSIS — M25312 Other instability, left shoulder: Secondary | ICD-10-CM | POA: Diagnosis not present

## 2017-08-26 DIAGNOSIS — R17 Unspecified jaundice: Secondary | ICD-10-CM | POA: Diagnosis not present

## 2017-10-31 DIAGNOSIS — Z1211 Encounter for screening for malignant neoplasm of colon: Secondary | ICD-10-CM | POA: Diagnosis not present

## 2018-01-13 ENCOUNTER — Ambulatory Visit
Admission: RE | Admit: 2018-01-13 | Payer: Commercial Managed Care - PPO | Source: Ambulatory Visit | Admitting: Unknown Physician Specialty

## 2018-01-13 ENCOUNTER — Encounter: Admission: RE | Payer: Self-pay | Source: Ambulatory Visit

## 2018-01-13 SURGERY — COLONOSCOPY WITH PROPOFOL
Anesthesia: General

## 2018-01-17 DIAGNOSIS — N400 Enlarged prostate without lower urinary tract symptoms: Secondary | ICD-10-CM | POA: Insufficient documentation

## 2018-01-17 DIAGNOSIS — E7800 Pure hypercholesterolemia, unspecified: Secondary | ICD-10-CM | POA: Diagnosis not present

## 2018-01-17 DIAGNOSIS — R7302 Impaired glucose tolerance (oral): Secondary | ICD-10-CM | POA: Diagnosis not present

## 2018-01-17 DIAGNOSIS — M25511 Pain in right shoulder: Secondary | ICD-10-CM | POA: Diagnosis not present

## 2018-01-25 ENCOUNTER — Other Ambulatory Visit: Payer: Commercial Managed Care - PPO

## 2018-01-25 DIAGNOSIS — R972 Elevated prostate specific antigen [PSA]: Secondary | ICD-10-CM

## 2018-01-26 LAB — FPSA% REFLEX
% FREE PSA: 17.6 %
PSA, FREE: 1.34 ng/mL

## 2018-01-26 LAB — PSA TOTAL (REFLEX TO FREE): Prostate Specific Ag, Serum: 7.6 ng/mL — ABNORMAL HIGH (ref 0.0–4.0)

## 2018-01-27 ENCOUNTER — Ambulatory Visit (INDEPENDENT_AMBULATORY_CARE_PROVIDER_SITE_OTHER): Payer: Commercial Managed Care - PPO | Admitting: Urology

## 2018-01-27 VITALS — BP 122/70 | HR 76 | Ht 67.0 in | Wt 183.0 lb

## 2018-01-27 DIAGNOSIS — R972 Elevated prostate specific antigen [PSA]: Secondary | ICD-10-CM

## 2018-01-27 NOTE — Progress Notes (Signed)
01/27/2018 3:02 PM   Bryan Mcneil [birth year] [id number]  Referring provider: Juluis Pitch, MD [phone] [place], [place] [address]  Chief Complaint  Patient presents with  . Elevated PSA    HPI: The patient is a 57 year old gentleman presents today for follow-up of his elevated PSA and erectile dysfunction.  1. Elevated PSA He underwent a prostate biopsy in April 2017 that was negative for malignancy but did show acute and chronic inflammation. His PSA at that time was 7.2.His DRE at that time was also positive for a right sided firmness compared to the left.He was rechecked in November 2017 was 5.8 with a free PSA percentage free of 22.2. This puts him at a 10% risk of prostate cancer. His PSA in May 2018 was 5.5. His free PSA percentage was 19.6. PSA  In November 2018 was 7.1 with percent free of 18.6%.  Repeat PSA in May 2019 was 7.6 with a free PSA of 17.6%.  DRE inNovember 2018 was 2+ with the right side slightly more indurated than the left which was also noted on previous exams and had not changed.  2. Erectile dysfunction The patient is on generic sildenafil at this time. He does not use the medication every time. He does note a slight headache when he takes it. He is not interested in trying another medication asother phosphodiesterase inhibitotors have been costprohibitive.  Today, reports that he is doing well.  He has minimal urinary symptoms.  He has nocturia x1.  He has a good stream and feels like he empties his bladder.  He denies any recent gross hematuria, nephrolithiasis, or UTI.  PMH: Past Medical History:  Diagnosis Date  . BPH (benign prostatic hyperplasia)   . Erectile dysfunction   . Hypogonadism in male   . Testicular pain, left   . Weak urinary stream     Surgical History: Past Surgical History:  Procedure Laterality Date  . BACK SURGERY  2006  . CHOLECYSTECTOMY    . GALLBLADDER SURGERY  2002  . Metal plate in neck       Home Medications:  Allergies as of 01/27/2018      Reactions   Vicodin [hydrocodone-acetaminophen] Shortness Of Breath      Medication List        Accurate as of 01/27/18  3:02 PM. Always use your most recent med list.          meloxicam 15 MG tablet Commonly known as:  MOBIC Take 15 mg by mouth daily.   sildenafil 20 MG tablet Commonly known as:  REVATIO Take 20 mg by mouth as needed (1-5 TABLETS AS NEEDED).   tamsulosin 0.4 MG Caps capsule Commonly known as:  FLOMAX Take 0.4 mg by mouth daily.       Allergies:  Allergies  Allergen Reactions  . Vicodin [Hydrocodone-Acetaminophen] Shortness Of Breath    Family History: Family History  Problem Relation Age of Onset  . Prostate cancer Father   . Bladder Cancer Neg Hx   . Kidney disease Neg Hx     Social History:  reports that he quit smoking about 8 years ago. He has never used smokeless tobacco. He reports that he drinks alcohol. He reports that he does not use drugs.  ROS: UROLOGY Frequent Urination?: No Hard to postpone urination?: No Burning/pain with urination?: No Get up at night to urinate?: No Leakage of urine?: No Urine stream starts and stops?: No Trouble starting stream?: No Do you have to strain  to urinate?: No Blood in urine?: No Urinary tract infection?: No Sexually transmitted disease?: No Injury to kidneys or bladder?: No Painful intercourse?: No Weak stream?: No Erection problems?: No Penile pain?: No  Gastrointestinal Nausea?: No Vomiting?: No Indigestion/heartburn?: No Diarrhea?: No Constipation?: No  Constitutional Fever: No Night sweats?: No Weight loss?: No Fatigue?: No  Skin Skin rash/lesions?: No Itching?: No  Eyes Blurred vision?: No Double vision?: No  Ears/Nose/Throat Sore throat?: No Sinus problems?: No  Hematologic/Lymphatic Swollen glands?: No Easy bruising?: No  Cardiovascular Leg swelling?: No Chest pain?: No  Respiratory Cough?:  No Shortness of breath?: No  Endocrine Excessive thirst?: No  Musculoskeletal Back pain?: No Joint pain?: No  Neurological Headaches?: No Dizziness?: No  Psychologic Depression?: No Anxiety?: No  Physical Exam: BP 122/70   Pulse 76   Ht 5\' 7"  (1.702 m)   Wt 183 lb (83 kg)   BMI 28.66 kg/m   Constitutional:  Alert and oriented, No acute distress. HEENT: [place] AT, moist mucus membranes.  Trachea midline, no masses. Cardiovascular: No clubbing, cyanosis, or edema. Respiratory: Normal respiratory effort, no increased work of breathing. GI: Abdomen is soft, nontender, nondistended, no abdominal masses GU: No CVA tenderness.  Skin: No rashes, bruises or suspicious lesions. Lymph: No cervical or inguinal adenopathy. Neurologic: Grossly intact, no focal deficits, moving all 4 extremities. Psychiatric: Normal mood and affect.  Laboratory Data: Lab Results  Component Value Date   WBC 5.1 08/01/2012   HGB 13.7 08/01/2012   HCT 40.2 08/01/2012   MCV 89 08/01/2012   PLT 204 08/01/2012    Lab Results  Component Value Date   CREATININE 0.69 08/01/2012    No results found for: PSA  No results found for: TESTOSTERONE  No results found for: HGBA1C  Urinalysis    Component Value Date/Time   COLORURINE Yellow 08/01/2012 1418   APPEARANCEUR Clear 12/12/2015 1636   LABSPEC 1.025 08/01/2012 1418   PHURINE 5.0 08/01/2012 1418   GLUCOSEU Negative 12/12/2015 1636   GLUCOSEU Negative 08/01/2012 1418   HGBUR 3+ 08/01/2012 1418   BILIRUBINUR Negative 12/12/2015 1636   BILIRUBINUR Negative 08/01/2012 1418   KETONESUR Trace 08/01/2012 1418   PROTEINUR Negative 12/12/2015 1636   PROTEINUR Negative 08/01/2012 1418   NITRITE Negative 12/12/2015 1636   NITRITE Negative 08/01/2012 1418   LEUKOCYTESUR Negative 12/12/2015 1636   LEUKOCYTESUR Negative 08/01/2012 1418    Assessment & Plan:    1. Elevated PSA Repeat PSA in 6 months.    PSA did slightly rise in the last 6 months,  however it is only slightly higher than his PSA was at the time of his negative prostate biopsy.  We will continue to keep a close eye on this and will need to consider an MRI versus repeat prostate biopsy if his PSA continues to rise.  2. Erectile dysfunction Continue sildenafil as needed  Return in about 6 months (around 07/30/2018) for PSA prior.  Nickie Retort, MD  [facility] [address], [place], [place] [address] [phone]

## 2018-03-30 ENCOUNTER — Encounter: Payer: Self-pay | Admitting: *Deleted

## 2018-03-31 ENCOUNTER — Ambulatory Visit: Payer: Commercial Managed Care - PPO | Admitting: Anesthesiology

## 2018-03-31 ENCOUNTER — Other Ambulatory Visit: Payer: Self-pay

## 2018-03-31 ENCOUNTER — Encounter: Payer: Self-pay | Admitting: *Deleted

## 2018-03-31 ENCOUNTER — Ambulatory Visit
Admission: RE | Admit: 2018-03-31 | Discharge: 2018-03-31 | Disposition: A | Discharge status: Home / Self Care | Payer: Commercial Managed Care - PPO | Source: Ambulatory Visit | Attending: Unknown Physician Specialty | Admitting: Unknown Physician Specialty

## 2018-03-31 ENCOUNTER — Encounter
Admission: RE | Disposition: A | Discharge status: Home / Self Care | Payer: Self-pay | Source: Ambulatory Visit | Attending: Unknown Physician Specialty

## 2018-03-31 DIAGNOSIS — N400 Enlarged prostate without lower urinary tract symptoms: Secondary | ICD-10-CM | POA: Diagnosis not present

## 2018-03-31 DIAGNOSIS — K648 Other hemorrhoids: Secondary | ICD-10-CM | POA: Diagnosis not present

## 2018-03-31 DIAGNOSIS — D124 Benign neoplasm of descending colon: Secondary | ICD-10-CM | POA: Insufficient documentation

## 2018-03-31 DIAGNOSIS — K635 Polyp of colon: Secondary | ICD-10-CM | POA: Diagnosis not present

## 2018-03-31 DIAGNOSIS — N529 Male erectile dysfunction, unspecified: Secondary | ICD-10-CM | POA: Diagnosis not present

## 2018-03-31 DIAGNOSIS — Z1211 Encounter for screening for malignant neoplasm of colon: Secondary | ICD-10-CM | POA: Insufficient documentation

## 2018-03-31 DIAGNOSIS — K640 First degree hemorrhoids: Secondary | ICD-10-CM | POA: Insufficient documentation

## 2018-03-31 DIAGNOSIS — Z87891 Personal history of nicotine dependence: Secondary | ICD-10-CM | POA: Insufficient documentation

## 2018-03-31 DIAGNOSIS — Z79899 Other long term (current) drug therapy: Secondary | ICD-10-CM | POA: Diagnosis not present

## 2018-03-31 HISTORY — DX: Elevated prostate specific antigen (PSA): R97.20

## 2018-03-31 HISTORY — PX: COLONOSCOPY WITH PROPOFOL: SHX5780

## 2018-03-31 HISTORY — DX: Unspecified jaundice: R17

## 2018-03-31 HISTORY — DX: Impaired glucose tolerance (oral): R73.02

## 2018-03-31 SURGERY — COLONOSCOPY WITH PROPOFOL
Anesthesia: General

## 2018-03-31 MED ORDER — PROPOFOL 500 MG/50ML IV EMUL
INTRAVENOUS | Status: DC | PRN
  Administered 2018-03-31: 125 ug/kg/min via INTRAVENOUS

## 2018-03-31 MED ORDER — PROPOFOL 10 MG/ML IV BOLUS
INTRAVENOUS | Status: DC | PRN
  Administered 2018-03-31: 50 mg via INTRAVENOUS
  Administered 2018-03-31: 20 mg via INTRAVENOUS

## 2018-03-31 MED ORDER — PROPOFOL 500 MG/50ML IV EMUL
INTRAVENOUS | Status: AC
  Filled 2018-03-31: qty 50

## 2018-03-31 MED ORDER — SODIUM CHLORIDE 0.9 % IV SOLN
INTRAVENOUS | Status: DC
  Administered 2018-03-31: 14:00:00 via INTRAVENOUS

## 2018-03-31 MED ORDER — SODIUM CHLORIDE 0.9 % IV SOLN: INTRAVENOUS | Status: DC

## 2018-03-31 NOTE — Anesthesia Post-op Follow-up Note (Signed)
Anesthesia QCDR form completed.        

## 2018-03-31 NOTE — Op Note (Signed)
[facility] Patient Name: Bryan Mcneil Procedure Date: 03/31/2018 2:01 PM MRN: [id number] Account #: [id number] Date of Birth: [birth year] Admit Type: Outpatient Age: 57 Room: [facility] ENDO ROOM 1 Gender: Male Note Status: Finalized Procedure:            Colonoscopy Indications:          Screening for colorectal malignant neoplasm Providers:            Manya Silvas, MD Referring MD:         [facility] (Referring MD) Medicines:            Propofol per Anesthesia Complications:        No immediate complications. Procedure:            Pre-Anesthesia Assessment:                       - After reviewing the risks and benefits, the patient                        was deemed in satisfactory condition to undergo the                        procedure.                       After obtaining informed consent, the colonoscope was                        passed under direct vision. Throughout the procedure,                        the patient's blood pressure, pulse, and oxygen                        saturations were monitored continuously. The                        Colonoscope was introduced through the anus and                        advanced to the the cecum, identified by appendiceal                        orifice and ileocecal valve. The colonoscopy was                        performed without difficulty. The patient tolerated the                        procedure well. The quality of the bowel preparation                        was good. Findings:      A small -medium polyp was found in the transverse colon. The polyp was       sessile. The polyp was removed with a hot snare. Resection and retrieval       were complete.      Internal hemorrhoids were found during endoscopy. The hemorrhoids were       small and Grade I (internal hemorrhoids that do not prolapse).      The  exam was otherwise without abnormality. Impression:            - One small polyp in the transverse colon, removed with                        a hot snare. Resected and retrieved.                       - Internal hemorrhoids.                       - The examination was otherwise normal. Recommendation:       - Await pathology results. Manya Silvas, MD 03/31/2018 2:36:49 PM This report has been signed electronically. Number of Addenda: 0 Note Initiated On: 03/31/2018 2:01 PM Scope Withdrawal Time: 0 hours 7 minutes 11 seconds  Total Procedure Duration: 0 hours 23 minutes 40 seconds       [facility]

## 2018-03-31 NOTE — H&P (Signed)
Primary Care Physician:  [facility] Primary Gastroenterologist:  Dr. Vira Agar  Pre-Procedure History & Physical: HPI:  Bryan Mcneil is a 57 y.o. male is here for an colonoscopy.  This is for colon cancer screening.   Past Medical History:  Diagnosis Date  . BPH (benign prostatic hyperplasia)   . Elevated PSA   . Erectile dysfunction   . Hypogonadism in male   . IGT (impaired glucose tolerance)   . Serum total bilirubin elevated   . Testicular pain, left   . Weak urinary stream     Past Surgical History:  Procedure Laterality Date  . BACK SURGERY  2006  . CHOLECYSTECTOMY    . GALLBLADDER SURGERY  2002  . Metal plate in neck      Prior to Admission medications   Medication Sig Start Date End Date Taking? Authorizing Provider  calcium gluconate 500 MG tablet Take 1 tablet by mouth 3 (three) times daily.    [provider]  meloxicam (MOBIC) 15 MG tablet Take 15 mg by mouth daily.    [provider]  sildenafil (REVATIO) 20 MG tablet Take 20 mg by mouth as needed (1-5 TABLETS AS NEEDED).    [provider]  tamsulosin (FLOMAX) 0.4 MG CAPS capsule Take 0.4 mg by mouth daily.    [provider]    Allergies as of 01/13/2018 - Review Complete 01/12/2018  Allergen Reaction Noted  . Vicodin [hydrocodone-acetaminophen] Shortness Of Breath 05/05/2015    Family History  Problem Relation Age of Onset  . Prostate cancer Father   . Bladder Cancer Neg Hx   . Kidney disease Neg Hx     Social History   Socioeconomic History  . Marital status: Married    Spouse name: Not on file  . Number of children: Not on file  . Years of education: Not on file  . Highest education level: Not on file  Occupational History  . Not on file  Social Needs  . Financial resource strain: Not on file  . Food insecurity:    Worry: Not on file    Inability: Not on file  . Transportation needs:    Medical: Not on file    Non-medical:  Not on file  Tobacco Use  . Smoking status: Former Smoker    Last attempt to quit: 01/21/2010    Years since quitting: 8.1  . Smokeless tobacco: Never Used  . Tobacco comment: quit 4 years ago  Substance and Sexual Activity  . Alcohol use: Yes    Alcohol/week: 0.0 oz    Comment: occasional  . Drug use: No  . Sexual activity: Not on file  Lifestyle  . Physical activity:    Days per week: Not on file    Minutes per session: Not on file  . Stress: Not on file  Relationships  . Social connections:    Talks on phone: Not on file    Gets together: Not on file    Attends religious service: Not on file    Active member of club or organization: Not on file    Attends meetings of clubs or organizations: Not on file    Relationship status: Not on file  . Intimate partner violence:    Fear of current or ex partner: Not on file    Emotionally abused: Not on file    Physically abused: Not on file    Forced sexual activity: Not on file  Other Topics Concern  .  Not on file  Social History Narrative  . Not on file    Review of Systems: See HPI, otherwise negative ROS  Physical Exam: BP (!) 142/82   Pulse 75   Temp (!) 96.6 F (35.9 C) (Tympanic)   Resp 18   Ht 5\' 7"  (1.702 m)   Wt 79.8 kg (176 lb)   SpO2 99%   BMI 27.57 kg/m  General:   Alert,  pleasant and cooperative in NAD Head:  Normocephalic and atraumatic. Neck:  Supple; no masses or thyromegaly. Lungs:  Clear throughout to auscultation.    Heart:  Regular rate and rhythm. Abdomen:  Soft, nontender and nondistended. Normal bowel sounds, without guarding, and without rebound.   Neurologic:  Alert and  oriented x4;  grossly normal neurologically.  Impression/Plan: Bryan Mcneil is here for an colonoscopy to be performed for colon cancer screening.  Risks, benefits, limitations, and alternatives regarding  colonoscopy have been reviewed with the patient.  Questions have been answered.  All parties  agreeable.   Gaylyn Cheers, MD  03/31/2018, 2:01 PM

## 2018-03-31 NOTE — Anesthesia Preprocedure Evaluation (Signed)
Anesthesia Evaluation  Patient identified by MRN, date of birth, ID band Patient awake    Reviewed: Allergy & Precautions, H&P , NPO status , Patient's Chart, lab work & pertinent test results, reviewed documented beta blocker date and time   Airway Mallampati: II  TM Distance: >3 FB Neck ROM: full    Dental  (+) Dental Advidsory Given, Poor Dentition   Pulmonary neg pulmonary ROS, former smoker,           Cardiovascular Exercise Tolerance: Good negative cardio ROS       Neuro/Psych negative neurological ROS  negative psych ROS   GI/Hepatic negative GI ROS, Neg liver ROS,   Endo/Other  negative endocrine ROS  Renal/GU Renal disease (kidney stones)  negative genitourinary   Musculoskeletal   Abdominal   Peds  Hematology negative hematology ROS (+)   Anesthesia Other Findings Past Medical History: No date: BPH (benign prostatic hyperplasia) No date: Elevated PSA No date: Erectile dysfunction No date: Hypogonadism in male No date: IGT (impaired glucose tolerance) No date: Serum total bilirubin elevated No date: Testicular pain, left No date: Weak urinary stream   Reproductive/Obstetrics negative OB ROS                             Anesthesia Physical Anesthesia Plan  ASA: I  Anesthesia Plan: General   Post-op Pain Management:    Induction: Intravenous  PONV Risk Score and Plan: 2 and Propofol infusion and TIVA  Airway Management Planned: Nasal Cannula  Additional Equipment:   Intra-op Plan:   Post-operative Plan:   Informed Consent: I have reviewed the patients History and Physical, chart, labs and discussed the procedure including the risks, benefits and alternatives for the proposed anesthesia with the patient or authorized representative who has indicated his/her understanding and acceptance.   Dental Advisory Given  Plan Discussed with: Anesthesiologist, CRNA and  Surgeon  Anesthesia Plan Comments:         Anesthesia Quick Evaluation

## 2018-03-31 NOTE — Transfer of Care (Signed)
Immediate Anesthesia Transfer of Care Note  Patient: Bryan Mcneil  Procedure(s) Performed: COLONOSCOPY WITH PROPOFOL (N/A )  Patient Location: PACU and Endoscopy Unit  Anesthesia Type:General  Level of Consciousness: awake, alert  and oriented  Airway & Oxygen Therapy: Patient Spontanous Breathing and Patient connected to nasal cannula oxygen  Post-op Assessment: Report given to RN and Post -op Vital signs reviewed and stable  Post vital signs: Reviewed and stable  Last Vitals:  Vitals Value Taken Time  BP 103/64 03/31/2018  2:36 PM  Temp 36.1 C 03/31/2018  2:35 PM  Pulse 64 03/31/2018  2:37 PM  Resp 15 03/31/2018  2:37 PM  SpO2 99 % 03/31/2018  2:37 PM  Vitals shown include unvalidated device data.  Last Pain:  Vitals:   03/31/18 1435  TempSrc: Tympanic  PainSc:          Complications: No apparent anesthesia complications

## 2018-04-01 NOTE — Anesthesia Postprocedure Evaluation (Signed)
Anesthesia Post Note  Patient: Bryan Mcneil  Procedure(s) Performed: COLONOSCOPY WITH PROPOFOL (N/A )  Patient location during evaluation: Endoscopy Anesthesia Type: General Level of consciousness: awake and alert Pain management: pain level controlled Vital Signs Assessment: post-procedure vital signs reviewed and stable Respiratory status: spontaneous breathing, nonlabored ventilation, respiratory function stable and patient connected to nasal cannula oxygen Cardiovascular status: blood pressure returned to baseline and stable Postop Assessment: no apparent nausea or vomiting Anesthetic complications: no     Last Vitals:  Vitals:   03/31/18 1435 03/31/18 1445  BP: 103/64 105/74  Pulse:    Resp: 16   Temp: (!) 36.1 C   SpO2:      Last Pain:  Vitals:   03/31/18 1455  TempSrc:   PainSc: 0-No pain                 Martha Clan

## 2018-04-02 ENCOUNTER — Encounter: Payer: Self-pay | Admitting: Unknown Physician Specialty

## 2018-04-04 LAB — SURGICAL PATHOLOGY

## 2018-07-25 DIAGNOSIS — Z0001 Encounter for general adult medical examination with abnormal findings: Secondary | ICD-10-CM | POA: Diagnosis not present

## 2018-07-25 DIAGNOSIS — R972 Elevated prostate specific antigen [PSA]: Secondary | ICD-10-CM | POA: Diagnosis not present

## 2018-07-25 DIAGNOSIS — N400 Enlarged prostate without lower urinary tract symptoms: Secondary | ICD-10-CM | POA: Diagnosis not present

## 2018-08-02 ENCOUNTER — Other Ambulatory Visit: Payer: Commercial Managed Care - PPO

## 2018-08-02 DIAGNOSIS — R972 Elevated prostate specific antigen [PSA]: Secondary | ICD-10-CM | POA: Diagnosis not present

## 2018-08-03 LAB — FPSA% REFLEX
% FREE PSA: 13.3 %
PSA, FREE: 0.97 ng/mL

## 2018-08-03 LAB — PSA TOTAL (REFLEX TO FREE): Prostate Specific Ag, Serum: 7.3 ng/mL — ABNORMAL HIGH (ref 0.0–4.0)

## 2018-08-04 ENCOUNTER — Ambulatory Visit: Payer: Commercial Managed Care - PPO | Admitting: Urology

## 2018-08-04 DIAGNOSIS — Z0001 Encounter for general adult medical examination with abnormal findings: Secondary | ICD-10-CM | POA: Diagnosis not present

## 2018-08-08 ENCOUNTER — Ambulatory Visit: Payer: Commercial Managed Care - PPO | Admitting: Urology

## 2018-08-29 ENCOUNTER — Encounter: Payer: Self-pay | Admitting: Urology

## 2018-08-29 ENCOUNTER — Ambulatory Visit: Payer: Commercial Managed Care - PPO | Admitting: Urology

## 2018-08-29 VITALS — BP 125/78 | HR 77 | Ht 67.0 in | Wt 180.0 lb

## 2018-08-29 DIAGNOSIS — Z125 Encounter for screening for malignant neoplasm of prostate: Secondary | ICD-10-CM

## 2018-08-29 MED ORDER — TAMSULOSIN HCL 0.4 MG PO CAPS: 0.4000 mg | ORAL_CAPSULE | Freq: Every day | ORAL | 11 refills | Status: DC

## 2018-08-29 MED ORDER — SILDENAFIL CITRATE 20 MG PO TABS: 20.0000 mg | ORAL_TABLET | ORAL | 11 refills | Status: DC | PRN

## 2018-08-29 NOTE — Progress Notes (Addendum)
   08/29/2018 2:44 PM   Bryan Mcneil 19-Jul-1961 [id number]  Reason for visit: Follow up elevated PSA  HPI: I saw Bryan Mcneil in urology clinic today for elevated PSA.  Today's visit was conducted via a Patent attorney.  He was previously followed by Dr. Pilar Jarvis.  He is a healthy 57 year old Spanish-speaking male with a long history of elevated PSA.  He underwent biopsy in April 2017 for an elevated PSA of 7.2, and biopsy showed an 80g prostate with a small median lobe, with acute and chronic inflammation but no prostate cancer seen.  At the time of biopsy he reportedly had an irregular DRE, with some firmness noted on the right side.  He does have a family history of lethal prostate cancer in his father who passed away in his late 6s.  PSA is stable at 7.3 from 7.1 last year, and 7.2 at time of biopsy in 2017.  Percentage free has ranged from 14 to 19%.  PSA density is 0.09.  He has mild urinary symptoms that are well controlled on Flomax.  He also takes sildenafil as needed for erections.  He denies any gross hematuria, bone pain, or weight loss.  There are no aggravating or alleviating factors.  Pain is 0 out of 10.    ROS: Please see flowsheet from today's date for complete review of systems.  Physical Exam: There were no vitals taken for this visit.   Constitutional:  Alert and oriented, No acute distress. Respiratory: Normal respiratory effort, no increased work of breathing. GI: Abdomen is soft, nontender, nondistended, no abdominal masses GU: No CVA tenderness DRE: Patient refused DRE today Skin: No rashes, bruises or suspicious lesions. Neurologic: Grossly intact, no focal deficits, moving all 4 extremities. Psychiatric: Normal mood and affect  Laboratory Data: PSA trend reviewed  Pertinent Imaging: None to review  Assessment & Plan:   In summary, Bryan Mcneil is a 57 year old Spanish-speaking male with a family history of lethal  prostate cancer, stable PSA of 7.3 over the last 2 years, and history of negative prostate biopsy in April 2017 with 80g gland.  He refused rectal exam today.  We discussed the reassuring nature of his stable PSA from time of his biopsy, and the fact that his PSA density is also reassuring.  However, with his family history of lethal cancer we do need to continue to monitor his PSA closely.  If his PSA rises in the future, he may benefit from a 4K score and/or prostate MRI.  Follow-up in 1 year with PSA/DRE  Billey Co, MD  [facility] [address] [place], [place] [address] [phone]

## 2018-11-22 DIAGNOSIS — M542 Cervicalgia: Secondary | ICD-10-CM | POA: Diagnosis not present

## 2018-11-22 DIAGNOSIS — M50322 Other cervical disc degeneration at C5-C6 level: Secondary | ICD-10-CM | POA: Diagnosis not present

## 2018-11-22 DIAGNOSIS — M25511 Pain in right shoulder: Secondary | ICD-10-CM | POA: Diagnosis not present

## 2019-08-28 ENCOUNTER — Other Ambulatory Visit: Payer: Commercial Managed Care - PPO

## 2019-08-28 ENCOUNTER — Other Ambulatory Visit: Payer: Self-pay

## 2019-08-28 DIAGNOSIS — Z125 Encounter for screening for malignant neoplasm of prostate: Secondary | ICD-10-CM

## 2019-08-29 LAB — FPSA% REFLEX
% FREE PSA: 16.6 %
PSA, FREE: 1.53 ng/mL

## 2019-08-29 LAB — PSA TOTAL (REFLEX TO FREE): Prostate Specific Ag, Serum: 9.2 ng/mL — ABNORMAL HIGH (ref 0.0–4.0)

## 2019-08-30 ENCOUNTER — Ambulatory Visit: Payer: Commercial Managed Care - PPO | Admitting: Urology

## 2019-08-30 ENCOUNTER — Other Ambulatory Visit: Payer: Self-pay

## 2019-08-30 ENCOUNTER — Encounter: Payer: Self-pay | Admitting: Urology

## 2019-08-30 VITALS — BP 158/84 | HR 79 | Ht 67.0 in | Wt 177.0 lb

## 2019-08-30 DIAGNOSIS — N401 Enlarged prostate with lower urinary tract symptoms: Secondary | ICD-10-CM | POA: Diagnosis not present

## 2019-08-30 DIAGNOSIS — N529 Male erectile dysfunction, unspecified: Secondary | ICD-10-CM

## 2019-08-30 DIAGNOSIS — R972 Elevated prostate specific antigen [PSA]: Secondary | ICD-10-CM | POA: Diagnosis not present

## 2019-08-30 DIAGNOSIS — N138 Other obstructive and reflux uropathy: Secondary | ICD-10-CM

## 2019-08-30 MED ORDER — SILDENAFIL CITRATE 20 MG PO TABS: 20.0000 mg | ORAL_TABLET | Freq: Every day | ORAL | 11 refills | Status: DC | PRN

## 2019-08-30 MED ORDER — TAMSULOSIN HCL 0.4 MG PO CAPS: 0.4000 mg | ORAL_CAPSULE | Freq: Every day | ORAL | 11 refills | Status: DC

## 2019-08-30 NOTE — Progress Notes (Signed)
   08/30/2019 11:00 AM   Bryan Mcneil 1961/04/11 [id number]  Reason for visit: Follow up elevated PSA, BPH, ED  HPI: I saw Mr. Bryan Mcneil in urology clinic today for the above issues.  Today's visit was conducted with the aid of a Patent attorney.  He is a healthy 58 year old male with a family history of lethal prostate cancer in his father around age 28 who has been followed in our clinic for a long history of elevated PSA.  He was previously followed by Dr. Pilar Jarvis.  He underwent a prostate biopsy in April 2017 for an elevated PSA of 7.2, and biopsy showed an 80 g prostate with a small median lobe, with acute and chronic inflammation but no prostate cancer seen.  He reportedly had a irregular DRE at that time with some firmness on the right side.  PSA this year is slightly elevated from baseline at 9.2 from 7.3 last year.  He takes sildenafil as needed for erections with good results.  He has no complaints regarding his ED.  He has mild urinary symptoms that are well controlled on Flomax.  He is also noted some urgency during the day, however he drinks coffee and soda.  He occasionally will have some weak stream, but overall is happy with how he is doing on the Flomax.   We had a long conversation about his ED and BPH and urinary symptoms today.  We will continue Revatio as needed for ED and Flomax for his BPH.  We also discussed behavioral strategies including minimizing coffee and soda in the diet regarding his urgency.  We also discussed the role of outlet procedures for BPH, however we first need to rule out prostate cancer, as these are treated very differently.  Regarding his elevated PSA, we discussed options including repeat transrectal ultrasound-guided biopsy, repeat PSA in a few weeks, or prostate MRI.  I recommended prostate MRI with his family history of lethal prostate cancer and rising PSA with history of negative ultrasound-guided biopsy.  We discussed the  next steps based on MRI findings-if suspicious lesions on MRI would proceed with fusion guided biopsy with [facility] in [place], however if biopsy is negative could consider outlet procedure like HOLEP if patient is interested.  Will call with prostate MRI results -If prostate MRI negative, RTC for PVR/IPSS, and discuss outlet procedures -If prostate MRI positive, fusion biopsy in [facility] and Revatio refilled  A total of 25 minutes were spent face-to-face with the patient, greater than 50% was spent in patient education, counseling, and coordination of care regarding elevated PSA, role of prostate MRI, BPH and treatment options, and ED.  Billey Co, [facility] [address] [place], [place] [address] [phone]

## 2019-08-30 NOTE — Patient Instructions (Signed)
Prueba de deteccin del cncer de prstata Prostate Cancer Screening  La prstata es una glndula del tamao de una nuez que se encuentra por debajo de la vejiga y en frente del recto en los hombres. La funcin de la prstata (glndula prosttica) es agregar lquido en el semen durante la eyaculacin. El cncer de prstata es el segundo tipo de cncer ms [employer]. Una prueba de deteccin del cncer es una prueba que se realiza antes de que comiencen los sntomas del cncer. La prueba de deteccin puede ayudar a identificar el cncer en una etapa temprana, cuando se puede tratar con mayor facilidad. La prueba de deteccin de cncer de prstata recomendada es un anlisis de sangre llamada prueba del antgeno prosttico especfico (prostate-specific antigen, PSA). El PSA es una protena que se produce en la prstata. A medida que usted envejece, la prstata produce naturalmente ms PSA. Los niveles muy altos de PSA pueden deberse a lo siguiente:  Cncer de prstata.  El agrandamiento de la prstata no causado por el cncer (hiperplasia prosttica benigna, hiperplasia prosttica benigna, HPB). Esta afeccin es muy frecuente en hombres de edad avanzada.  Una infeccin de la prstata (prostatitis).  Medicamentos para ayudar con el crecimiento del cabello, como la finasterida. Segn los [employer] del PSA, es posible que necesite someterse a ms pruebas, por ejemplo:  Un examen fsico para verificar el tamao de la prstata.  Anlisis de [place] y pruebas de diagnstico por imgenes.  Un procedimiento para extraer muestras de tejido de la prstata para analizarlas (biopsia). Quin debe realizarse la prueba de deteccin? Las recomendaciones para la prueba de deteccin varan segn la edad.  Si tiene menos de 40 aos, no se recomienda la prueba de Programme researcher, broadcasting/film/video.  Si tiene ente 58 y 71, y no tiene factores de [place], no se recomienda la prueba de Programme researcher, broadcasting/film/video.  Si tiene menos de 55 aos,  consulte al mdico acerca de la necesidad de la prueba de Programme researcher, broadcasting/film/video, en caso de presentar uno de estos factores de riesgo: ? Tener ascendencia afroamericana. ? Tener antecedentes familiares de cncer de prstata.  Si usted tiene entre 9 y 69, hable con el mdico sobre la necesidad de realizarse una prueba de deteccin y qu frecuencia debe Radiation protection practitioner.  Si tiene ms de 70 aos, no se recomienda la prueba de deteccin. Esto se debe a que los riesgos que puede ocasionar la prueba de deteccin son [employer] beneficios que puede proporcionar (los riesgos superan a los beneficios). Si tiene Public affairs consultant de cncer de prstata, el mdico puede recomendarle que se realice pruebas de deteccin con ms frecuencia o que comience a realizrselas a una edad ms temprana. Puede tener un riesgo alto si:  Tiene ms de 72 aos.  Es afroamericano.  Tiene un padre, un hermano o un to que han sido diagnosticados con cncer de prstata. El riesgo puede ser mayor si un familiar tuvo cncer en una edad temprana. Cules son los beneficios de la prueba de deteccin? Hay una probabilidad pequea de que la prueba de deteccin pueda reducir el riesgo de morir a causa del cncer de prstata. La posibilidad es pequea porque el cncer de prstata es un cncer de crecimiento lento y la mayora de los hombres con cncer de prstata fallecen debido a otra causa. Cules son los riesgos de la prueba de deteccin? El riesgo principal de la prueba de deteccin del cncer de prstata es el diagnstico y el tratamiento del cncer de prstata que nunca habra  causado ningn sntoma ni problema (sobrediagnstico y sobretratamiento). La prueba de deteccin del PSA no puede indicarle si su PSA es alto debido al cncer o a otra causa. Una biopsia de prstata es el nico procedimiento para Electrical engineer de prstata. Es posible que incluso los resultados de una biopsia no puedan indicarle si el cncer se debe tratar. Es  posible que el cncer de prstata de crecimiento lento no necesite ningn tratamiento excepto el control; por lo tanto el diagnstico y Dispensing optician pueden provocar un estrs innecesario u otros efectos secundarios. Una biopsia de prstata tambin puede causar:  Infeccin o fiebre.  Un resultado negativo falso. Este es un resultado que muestra que usted no tiene cncer de prstata cuando en realidad lo tiene. Preguntas para hacerle al mdico  Cundo debo comenzar a realizarme pruebas de deteccin del cncer de prstata?  Cul es mi riesgo de Best boy cncer de prstata?  Con qu frecuencia tengo que realizarme pruebas de deteccin?  Qu tipos de pruebas de deteccin necesito?  Cmo obtengo los [employer] pruebas?  [place] significan los resultados?  Necesito tratamiento? Comunquese con un mdico si:  Tiene dificultad para orinar.  Siente dolor al [place] o al [employer].  Observa sangre en la orina o en el semen.  Siente dolor en la espalda o en la zona de la prstata.  Tiene dificultar para lograr o Visual merchandiser ereccin (disfuncin erctil, DE). Resumen  El cncer de prstata es un tipo comn de cncer en los hombres. La prstata (glndula prosttica) se encuentra por debajo de la vejiga y en frente del recto. Esta glndula agrega lquido en el semen durante la eyaculacin.  Una prueba de deteccin de cncer de prstata puede ayudar a identificar el cncer en una etapa temprana, cuando se puede tratar con ms facilidad.  La prueba del antgeno prosttico especfico (PSA) es una prueba de deteccin del cncer de prstata.  Analice los riesgos y beneficios de las pruebas de Programme researcher, broadcasting/film/video del cncer de prstata con el mdico. Si tiene 70 aos o ms, es posible que una prueba de deteccin cause ms riesgos que beneficios (los riesgos superan a los beneficios). Esta informacin no tiene Marine scientist el consejo del mdico. Asegrese de hacerle al mdico cualquier  pregunta que tenga. Document Released: 09/16/2017 Document Revised: 12/17/2017 Document Reviewed: 09/16/2017 Elsevier Patient Education  2020 [employer].

## 2019-09-24 ENCOUNTER — Ambulatory Visit
Admission: RE | Admit: 2019-09-24 | Discharge: 2019-09-24 | Disposition: A | Discharge status: Home / Self Care | Payer: Commercial Managed Care - PPO | Source: Ambulatory Visit | Attending: Urology | Admitting: Urology

## 2019-09-24 ENCOUNTER — Other Ambulatory Visit: Payer: Self-pay

## 2019-09-24 DIAGNOSIS — R972 Elevated prostate specific antigen [PSA]: Secondary | ICD-10-CM

## 2019-09-26 ENCOUNTER — Other Ambulatory Visit: Payer: Self-pay

## 2019-09-26 ENCOUNTER — Ambulatory Visit
Admission: RE | Admit: 2019-09-26 | Discharge: 2019-09-26 | Disposition: A | Discharge status: Home / Self Care | Payer: Commercial Managed Care - PPO | Source: Ambulatory Visit | Attending: Urology | Admitting: Urology

## 2019-09-26 DIAGNOSIS — R972 Elevated prostate specific antigen [PSA]: Secondary | ICD-10-CM | POA: Insufficient documentation

## 2019-09-26 MED ORDER — GADOBUTROL 1 MMOL/ML IV SOLN
8.0000 mL | Freq: Once | INTRAVENOUS | Status: AC | PRN
  Administered 2019-09-26: 13:00:00 8 mL via INTRAVENOUS

## 2019-09-28 ENCOUNTER — Telehealth: Payer: Self-pay | Admitting: *Deleted

## 2019-09-28 DIAGNOSIS — R972 Elevated prostate specific antigen [PSA]: Secondary | ICD-10-CM

## 2019-09-28 NOTE — Telephone Encounter (Addendum)
Left VM via interpreter services to return call. Interpreter ID # [id number]  ----- Message from Billey Co, MD sent at 09/28/2019 11:20 AM EST ----- There is a 1cm spot seen on the prostate MRI that may be prostate cancer and needs to be biopsied. He will need to have this biopsied in [place] like we discussed in clinic.  Sharyn Lull- please schedule fusion biopsy in [place], and follow up with me in person 2 weeks after to discuss results, thanks  Nickolas Madrid, MD 09/28/2019

## 2019-09-28 NOTE — Telephone Encounter (Signed)
Referral placed.

## 2019-09-28 NOTE — Telephone Encounter (Signed)
I will need a referral please    Thanks, michelle

## 2019-10-08 NOTE — Telephone Encounter (Signed)
It was just sent 10 days ago and she is working on it. She will call him when she is ready to schedule him. sometimes it does take about 2 or 3 weeks before she calls them.   Sharyn Lull

## 2019-10-18 ENCOUNTER — Telehealth: Payer: Self-pay | Admitting: Urology

## 2019-10-18 NOTE — Telephone Encounter (Signed)
Error

## 2019-11-19 ENCOUNTER — Other Ambulatory Visit: Payer: Self-pay | Admitting: Urology

## 2019-11-21 ENCOUNTER — Other Ambulatory Visit: Payer: Self-pay

## 2019-11-21 ENCOUNTER — Ambulatory Visit (INDEPENDENT_AMBULATORY_CARE_PROVIDER_SITE_OTHER): Payer: Commercial Managed Care - PPO | Admitting: Urology

## 2019-11-21 ENCOUNTER — Encounter: Payer: Self-pay | Admitting: Urology

## 2019-11-21 VITALS — BP 138/76 | HR 73 | Ht 67.0 in | Wt 177.0 lb

## 2019-11-21 DIAGNOSIS — N529 Male erectile dysfunction, unspecified: Secondary | ICD-10-CM | POA: Diagnosis not present

## 2019-11-21 DIAGNOSIS — C61 Malignant neoplasm of prostate: Secondary | ICD-10-CM

## 2019-11-21 DIAGNOSIS — N401 Enlarged prostate with lower urinary tract symptoms: Secondary | ICD-10-CM

## 2019-11-21 DIAGNOSIS — N138 Other obstructive and reflux uropathy: Secondary | ICD-10-CM | POA: Diagnosis not present

## 2019-11-21 NOTE — Patient Instructions (Signed)
Cncer de prstata Prostate Cancer  La prstata es una glndula del tamao de una nuez que participa en la produccin de semen. Est ubicada debajo de la vejiga del hombre, frente al recto. El cncer de prstata es el crecimiento anormal de clulas en la prstata. Cules son las causas? Se desconoce la causa exacta de esta afeccin. Qu incrementa el riesgo? Es ms probable que Doctor, general practice en los hombres con las siguientes caractersticas:  Son [place] de 64 aos de edad.  Son afroamericanos.  Son obesos.  Tienen antecedentes familiares de cncer de prstata.  Tienen antecedentes familiares de cncer de mama. Cules son los signos o los sntomas? Los sntomas de esta afeccin [employer] siguientes:  Necesidad de Garment/textile technologist con frecuencia.  Flujo de orina dbil o que se interrumpe.  Dificultad para comenzar o Copy.  Imposibilidad para orinar.  Sensacin de dolor o ardor al [employer].  Eyaculacin dolorosa.  Sangre en la orina o el semen.  Dolor o Garment/textile technologist en la parte inferior de la espalda, la parte inferior del abdomen, la cadera o la parte superior de los muslos.  Problemas para tener una ereccin.  Problemas para vaciar la vejiga por completo. Cmo se diagnostica? Esta afeccin se puede diagnosticar a travs de lo siguiente:  Examen rectal digital. En este examen, un mdico introduce un dedo cubierto por un guante en el recto para palpar la prstata.  Un anlisis de sangre llamado prueba del antgeno prosttico especfico(PSA).  Un estudio de diagnstico por imgenes llamado ecografa transrectal.  Un procedimiento en el que se toma una muestra de tejido de la prstata y se la examina con un microscopio (biopsia de prstata). Una vez que se haya diagnosticado la afeccin, se realizarn pruebas para determinar cunto se ha extendido Science writer. Esto se llama determinar el estadio del cncer. En la determinacin del estadio podran  incluirse pruebas de diagnstico por imgenes, como:  [place] sea.  Una exploracin por tomografa computarizada (TC).  Una tomografa por emisin de protones (TEP).  Una resonancia magntica (RM). Estos son los estadios del cncer de prstata:  EstadioI. En este estadio, el cncer se encuentra solo en la prstata. El cncer no aparece en las pruebas de diagnstico por imgenes y, por lo general, se descubre por accidente, por ejemplo, durante una ciruga de prstata.  Estadio II. En este estadio, el cncer est ms avanzado que en el estadioI, pero no se ha extendido fuera de la prstata.  Estadio III. En este estadio, el cncer se ha extendido fuera de la capa exterior de la prstata hacia los tejidos cercanos. Es posible que se encuentre en las vesculas seminales, que estn cerca de la vejiga y de la prstata.  Estadio IV. En este estadio, el cncer se ha extendido a otras partes del cuerpo, como los [employer], los [place], la vejiga, el recto, el hgado o los pulmones. Cmo se trata? El tratamiento de esta afeccin depende de varios factores, incluidos el estadio del cncer, la edad, las preferencias personales y el estado de salud general. Hable con su mdico sobre cules alternativas de tratamiento son buenas para usted. Los tratamientos frecuentes son los siguientes:  Observacin en el caso del cncer de prstata en estadio temprano(vigilancia activa). Esto implica realizarse exmenes, anlisis de [place] y, en algunos [place], otras biopsias. En el caso de ciertos hombres, este es el nico tratamiento necesario.  Ciruga. Tipos de ciruga: ? [place]. En esta ciruga, se realiza una incisin grande  para extirpar la prstata. ? Prostatectoma laparoscpica. Es Ardelia Mems ciruga para extirpar la prstata y los ganglios linfticos a travs de varias incisiones pequeas. A esta ciruga a menudo se la considera como mnimamente invasiva. ? Prostatectoma robtica. Es  una ciruga que se realiza para extirpar la prstata y los ganglios linfticos con la ayuda de un brazo robtico que se controla mediante una computadora. ? Orquiectoma. Esta es una ciruga que se realiza para extirpar los testculos. ? [place]. Es una ciruga en la que se congelan y [place] clulas cancerosas.  Radioterapia. Tipos de tratamiento por radiacin: ? Radiacin externa. Apunta haces de radiacin desde afuera del cuerpo hacia la prstata para destruir las clulas cancerosas. ? [place]ia. Utiliza agujas, semillas, cables o sondas radioactivos que se implantan en la prstata. Al igual que la radiacin externa, la braquiterapia destruye las clulas cancerosas. Una ventaja es que este tipo de radiacin limita el dao en los tejidos circundantes y tiene menos efectos secundarios.  Ecografa focalizada de alta intensidad. Este tratamiento destruye las clulas cancerosas enviando ondas de ultrasonido de alta intensidad a las clulas cancerosas.  Medicamentos de quimioterapia. Este tratamiento destruye las clulas cancerosas o evita que se multipliquen.  Tratamiento hormonal. Este tratamiento implica el uso de medicamentos que actan sobre una de las hormonas masculinas(testosterona): ? Evitando que el cuerpo produzca testosterona. ? Evitando que la testosterona llegue hasta las clulas cancerosas. Siga estas indicaciones en su casa:  Tome los medicamentos de venta libre y los recetados solamente como se lo haya indicado el mdico.  Mantenga una dieta saludable.  Duerma lo suficiente.  Considere unirse a un grupo de apoyo para hombres que tienen cncer de prstata. Reunirse con un grupo de apoyo podra ayudarlo a aprender a hacerle frente al estrs de Animal nutritionist.  Concurra a todas las visitas de seguimiento como se lo haya indicado el mdico. Esto es importante.  Si tiene que ir a un hospital, infrmeselo al Production manager (onclogo).  El tratamiento contra  el cncer de prstata podra afectar la funcin sexual. Contine teniendo momentos ntimos con su pareja. Esto podra incluir tocarse, abrazarse y acariciarse. Comunquese con un mdico si:  Tiene problemas para orinar.  Observa sangre en la orina.  Siente dolor en la cadera, la espalda o el pecho. Solicite ayuda de inmediato si:  Siente debilidad o adormecimiento en las piernas.  No puede controlar la miccin o las deposiciones(incontinencia).  Tiene dificultad para respirar.  Siente sbitamente un dolor en el pecho.  Siente escalofros o tiene fiebre. Resumen  La prstata es una glndula del tamao de una nuez que participa en la produccin de semen. Est ubicada debajo de la vejiga del hombre, frente al recto. El cncer de prstata es el crecimiento anormal de clulas en la prstata.  El tratamiento de esta afeccin depende de varios factores, incluidos el estadio del cncer, la edad, las preferencias personales y el estado de salud general. Hable con su mdico sobre cules alternativas de tratamiento son buenas para usted.  Considere unirse a un grupo de apoyo para hombres que tienen cncer de prstata. Reunirse con un grupo de apoyo podra ayudarlo a aprender a hacerle frente al estrs de Animal nutritionist. Esta informacin no tiene Marine scientist el consejo del mdico. Asegrese de hacerle al mdico cualquier pregunta que tenga. Document Revised: 07/18/2017 Document Reviewed: 03/12/2016 Elsevier Patient Education  2020 [employer].

## 2019-11-21 NOTE — Progress Notes (Signed)
   11/21/2019 4:17 PM   Bryan Mcneil 1960-10-15 [id number]  Reason for visit: Discuss prostate biopsy results  HPI: I saw Bryan Mcneil back in urology clinic to discuss his prostate biopsy results. He is a healthy 59 year old Spanish-speaking male, in today's visit was conducted via Patent attorney. He does have a family history of lethal prostate cancer in his father in his mid 28. He has a history of a negative prostate biopsy in April 2017 for an elevated PSA of 7.2. PSA continued to rise and was 9.2 and he underwent a prostate MRI that showed a 110 g prostate with a PI-RADS three lesion in the right mid/apical gland measuring 1.3 cm. He underwent a fusion biopsy at [facility] with Dr. 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 PSA density is reassuring at 0.08.  He has mild urinary symptoms at baseline that are very well controlled with Flomax and he denies any urinary complaints today. He has ED that is well controlled with sildenafil on demand.  We had a lengthy conversation today about the patient's new diagnosis of prostate cancer.  We reviewed the risk classifications per the AUA guidelines including very low risk, low risk, intermediate risk, and high risk disease, and the need for additional staging imaging with CT and bone scan in patients with unfavorable intermediate risk and high risk disease.  I explained that his life expectancy, clinical stage, Gleason score, PSA, and other co-morbidities influence treatment strategies.  We discussed the roles of active surveillance, radiation therapy, surgical therapy with robotic prostatectomy, and hormone therapy with androgen deprivation.  We discussed that patients urinary symptoms also impact treatment strategy, as patients with severe lower urinary tract symptoms may have significant worsening or even develop  urinary retention after undergoing radiation.  In regards to surgery, we discussed robotic prostatectomy +/- lymphadenectomy at length.  The procedure takes 3 to 4 hours, and patient's typically discharge home on post-op day #1.  A Foley catheter is left in place for 7 to 10 days to allow for healing of the vesicourethral anastomosis.  There is a small risk of bleeding, infection, damage to surrounding structures or bowel, hernia, DVT/PE, or serious cardiac or pulmonary complications.  We discussed at length post-op side effects including erectile dysfunction, and the importance of pre-operative erectile function on long-term outcomes.  Even with a nerve sparing approach, there is an approximately 25% rate of permanent erectile dysfunction.  We also discussed postop urinary incontinence at length.  We expect patients to have stress incontinence post-operatively that will improve over period of weeks to months.  Less than 10% of men will require a pad at 1 year after surgery.  Patients will need to avoid heavy lifting and strenuous activity for 3 to 4 weeks, but most men return to their baseline activity status by 6 weeks.  In summary, Bryan Mcneil is a 59 y.o. man with newly diagnosed very low risk prostate cancer. He would like to pursue active surveillance  RTC 6 months for PSA  I spent 35 total minutes on the day of the encounter including pre-visit review of the medical record, face-to-face time with the patient, and post visit ordering of labs/imaging/tests.  Billey Co, [facility] [address] [place], [place] [address] [phone]

## 2020-05-23 ENCOUNTER — Other Ambulatory Visit: Payer: Self-pay

## 2020-05-23 ENCOUNTER — Other Ambulatory Visit: Payer: Commercial Managed Care - PPO

## 2020-05-23 DIAGNOSIS — C61 Malignant neoplasm of prostate: Secondary | ICD-10-CM

## 2020-05-24 LAB — PSA: Prostate Specific Ag, Serum: 7.9 ng/mL — ABNORMAL HIGH (ref 0.0–4.0)

## 2020-06-04 ENCOUNTER — Ambulatory Visit: Payer: Commercial Managed Care - PPO | Admitting: Urology

## 2020-06-04 ENCOUNTER — Encounter: Payer: Self-pay | Admitting: Urology

## 2020-06-04 ENCOUNTER — Other Ambulatory Visit: Payer: Self-pay

## 2020-06-04 VITALS — BP 142/70 | HR 72 | Ht 67.0 in | Wt 178.1 lb

## 2020-06-04 DIAGNOSIS — C61 Malignant neoplasm of prostate: Secondary | ICD-10-CM

## 2020-06-04 DIAGNOSIS — N138 Other obstructive and reflux uropathy: Secondary | ICD-10-CM | POA: Diagnosis not present

## 2020-06-04 DIAGNOSIS — N401 Enlarged prostate with lower urinary tract symptoms: Secondary | ICD-10-CM | POA: Diagnosis not present

## 2020-06-04 DIAGNOSIS — N529 Male erectile dysfunction, unspecified: Secondary | ICD-10-CM

## 2020-06-04 MED ORDER — SILDENAFIL CITRATE 20 MG PO TABS: 20.0000 mg | ORAL_TABLET | Freq: Every day | ORAL | 11 refills | Status: DC | PRN

## 2020-06-04 NOTE — Progress Notes (Signed)
° °  06/04/2020 10:58 AM   [name] 06-29-1961 [id number]  Reason for visit: Follow up prostate cancer, erectile dysfunction, BPH  HPI: I saw Mr. Bryan Mcneil in urology clinic for the above issues.  Briefly, he is a healthy 59 year old male with a family history of lethal prostate cancer in his father in his mid 34. He has a history of a negative prostate biopsy in April 2017 for an elevated PSA of 7.2. PSA continued to rise and was 9.2 and he underwent a prostate MRI that showed a 110 g prostate with a PI-RADS 3 lesion in the right mid/apical gland measuring 1.3 cm. He underwent a fusion biopsy at [facility] urology with Dr. 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 PSA density is reassuring at 0.08.  He opted for active surveillance, and returns today for PSA.  PSA stable at 7.9 from 9.2 nine months ago, and 7.3 one year ago.  He denies any worsening of his urinary symptoms, and continues on Flomax.  He denies any gross hematuria, weight loss, or bone pain.  Regarding his erectile dysfunction, he continues on 1-3 tabs of sildenafil as needed.  This helps him significantly with erections, but does give him a side effect of a headache sometimes.  We discussed considering Cialis, but he would like to continue sildenafil at this time.  We had a long conversation about his very low risk prostate cancer, and AUA and NCCN recommendations regarding active surveillance is the primary treatment strategy for patients with low risk disease.  With his relatively benign prostate MRI, very low PSA density, and single core of less than 5%, I think it is reasonable to defer a confirmatory biopsy, as he had a significant amount of pain with his biopsy previously.  We discussed the need for repeat biopsy if he were to have a significant rise in the PSA.  He is interested in potentially having a repeat  prostate biopsy in the future done at [facility], as he felt he tolerated this much better.  His original prostate biopsy in [place] was done in April 2017 by Dr. Erlene Quan.  Continue active surveillance for very low risk prostate cancer, RTC for PSA in 6 months Flomax refilled for BPH Sildenafil refilled for ED and coupon provided   Billey Co, MD  [facility] [address], [place], [place] [address] [phone]

## 2020-09-12 ENCOUNTER — Other Ambulatory Visit: Payer: Self-pay | Admitting: Urology

## 2020-12-02 ENCOUNTER — Other Ambulatory Visit: Payer: Self-pay

## 2020-12-04 ENCOUNTER — Ambulatory Visit: Payer: Self-pay | Admitting: Urology

## 2020-12-05 ENCOUNTER — Other Ambulatory Visit: Payer: Commercial Managed Care - PPO

## 2020-12-05 ENCOUNTER — Other Ambulatory Visit: Payer: Self-pay

## 2020-12-05 DIAGNOSIS — C61 Malignant neoplasm of prostate: Secondary | ICD-10-CM

## 2020-12-06 LAB — FPSA% REFLEX
% FREE PSA: 23 %
PSA, FREE: 1.61 ng/mL

## 2020-12-06 LAB — PSA TOTAL (REFLEX TO FREE): Prostate Specific Ag, Serum: 7 ng/mL — ABNORMAL HIGH (ref 0.0–4.0)

## 2020-12-15 ENCOUNTER — Encounter: Payer: Self-pay | Admitting: Urology

## 2020-12-15 ENCOUNTER — Ambulatory Visit (INDEPENDENT_AMBULATORY_CARE_PROVIDER_SITE_OTHER): Payer: Commercial Managed Care - PPO | Admitting: Urology

## 2020-12-15 ENCOUNTER — Other Ambulatory Visit: Payer: Self-pay

## 2020-12-15 VITALS — BP 119/65 | HR 70 | Ht 67.0 in | Wt 183.8 lb

## 2020-12-15 DIAGNOSIS — N138 Other obstructive and reflux uropathy: Secondary | ICD-10-CM | POA: Diagnosis not present

## 2020-12-15 DIAGNOSIS — N401 Enlarged prostate with lower urinary tract symptoms: Secondary | ICD-10-CM

## 2020-12-15 DIAGNOSIS — N529 Male erectile dysfunction, unspecified: Secondary | ICD-10-CM

## 2020-12-15 DIAGNOSIS — C61 Malignant neoplasm of prostate: Secondary | ICD-10-CM

## 2020-12-15 LAB — BLADDER SCAN AMB NON-IMAGING

## 2020-12-15 NOTE — Progress Notes (Signed)
   12/15/2020 10:14 AM   [name] 07-22-1961 [id number]  Reason for visit: Follow up prostate cancer, erectile dysfunction, BPH  HPI: I saw Mr. Bryan Mcneil in urology clinic for the above issues.  Briefly, he is a healthy 60 year old male with a family history of lethal prostate cancer in his father in his mid 43. He has a history of a negative prostate biopsy in April 2017 for an elevated PSA of 7.2. PSA continued to rise and was 9.2 and he underwent a prostate MRI that showed a 110 g prostate with a PI-RADS 3 lesion in the right mid/apical gland measuring 1.3 cm. He underwent a fusion biopsy at [facility] urology with Dr. Diona Fanti which showed no cancer in the ROI, however a single core with less than 5% involvement of Gleason score 3+3=6prostatic adenocarcinoma. PSA density is reassuring at 0.08.  He opted for active surveillance, and returns today for PSA.  PSA is stable at 7 from 7.9.  He denies any worsening of his urinary symptoms, and continues on Flomax.  He denies any gross hematuria, weight loss, or bone pain.  Primary urinary complaints are nocturia 1 time per night.  He is taking the Flomax in the afternoon.  Regarding his erectile dysfunction, he continues on 1-3 tabs of sildenafil as needed.  This helps him significantly with erections.  We had a long conversation about his very low risk prostate cancer, and AUA and NCCN recommendations regarding active surveillance is the primary treatment strategy for patients with low risk disease.  With his relatively benign prostate MRI, very low PSA density, and single core of less than 5%, I think it is reasonable to defer a confirmatory biopsy, as he had a significant amount of pain with his biopsy previously.  We discussed the need for repeat biopsy if he were to have a significant rise in the PSA. .  -Continue active surveillance for very low risk prostate cancer, RTC for PSA in 6 months-space to yearly at that time if  stable -Flomax refilled for BPH -Sildenafil refilled for ED    Billey Co, MD  [facility] [address] [place], [place] [address] [phone]

## 2021-09-04 ENCOUNTER — Encounter: Payer: Self-pay | Admitting: Urology

## 2021-09-10 ENCOUNTER — Other Ambulatory Visit: Payer: Self-pay

## 2021-09-10 ENCOUNTER — Other Ambulatory Visit: Payer: Commercial Managed Care - PPO

## 2021-09-10 DIAGNOSIS — C61 Malignant neoplasm of prostate: Secondary | ICD-10-CM

## 2021-09-11 ENCOUNTER — Other Ambulatory Visit: Payer: Self-pay

## 2021-09-11 LAB — PSA: Prostate Specific Ag, Serum: 7.4 ng/mL — ABNORMAL HIGH (ref 0.0–4.0)

## 2021-09-16 ENCOUNTER — Ambulatory Visit: Payer: Self-pay | Admitting: Urology

## 2021-09-16 ENCOUNTER — Other Ambulatory Visit: Payer: Self-pay | Admitting: Urology

## 2021-09-18 ENCOUNTER — Other Ambulatory Visit: Payer: Self-pay

## 2021-09-18 ENCOUNTER — Ambulatory Visit: Payer: Commercial Managed Care - PPO | Admitting: Urology

## 2021-09-18 ENCOUNTER — Encounter: Payer: Self-pay | Admitting: Urology

## 2021-09-18 VITALS — BP 146/81 | HR 72 | Ht 67.0 in | Wt 183.0 lb

## 2021-09-18 DIAGNOSIS — C61 Malignant neoplasm of prostate: Secondary | ICD-10-CM | POA: Diagnosis not present

## 2021-09-18 DIAGNOSIS — N138 Other obstructive and reflux uropathy: Secondary | ICD-10-CM

## 2021-09-18 DIAGNOSIS — N401 Enlarged prostate with lower urinary tract symptoms: Secondary | ICD-10-CM | POA: Diagnosis not present

## 2021-09-18 DIAGNOSIS — N529 Male erectile dysfunction, unspecified: Secondary | ICD-10-CM

## 2021-09-18 LAB — BLADDER SCAN AMB NON-IMAGING

## 2021-09-18 MED ORDER — SILDENAFIL CITRATE 20 MG PO TABS: 20.0000 mg | ORAL_TABLET | Freq: Every day | ORAL | 3 refills | Status: DC | PRN

## 2021-09-18 NOTE — Progress Notes (Signed)
° °  09/18/2021 10:56 AM   [name] 1961/03/31 [id number]  Reason for visit: Follow up low risk prostate cancer, ED, BPH  HPI: I saw Mr. Bryan Mcneil in urology clinic for the above issues.  Briefly, he is a healthy 60 year old male with a family history of lethal prostate cancer in his father in his mid 3. He has a history of a negative prostate biopsy in April 2017 for an elevated PSA of 7.2. PSA continued to rise and was 9.2 and he underwent a prostate MRI that showed a 110 g prostate with a PI-RADS 3 lesion in the right mid/apical gland measuring 1.3 cm. He underwent a fusion biopsy 10/2019 at [facility] urology with Dr. 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 PSA density is reassuring at 0.08.   He opted for active surveillance, and returns today for PSA.  PSA is stable at 7.4 from 7.9 nine months ago, and stable long-term with PSA of 7.4 in 2019.  He denies any worsening of his urinary symptoms, and continues on Flomax.  He denies any gross hematuria, weight loss, or bone pain.  Really does not have any significant urinary complaints today, is no longer having nocturia since starting Flomax, and feels like he empties well.  PVR is normal today at 28 mL.   Regarding his erectile dysfunction, he continues on 1-3 tabs of sildenafil as needed.  This helps him significantly with erections.   We had another conversation about his very low risk prostate cancer, and AUA and NCCN recommendations regarding active surveillance is the primary treatment strategy for patients with low risk disease.  He opted to defer confirmatory biopsy, as he had significant mount of pain with his initial fusion biopsy.  With his reassuring PSA density, single core of very small core involvement, and stable PSA, I think this is reasonable.  We discussed need to consider repeat prostate biopsy or repeat MRI  if PSA trended up.  -Flomax and sildenafil refilled -RTC 1 year PSA prior for surveillance of very low risk prostate cancer   Billey Co, MD  [facility] [address] [place], [place] [address] [phone]

## 2022-07-06 ENCOUNTER — Encounter: Payer: Self-pay | Admitting: Internal Medicine

## 2022-07-07 ENCOUNTER — Encounter: Payer: Self-pay | Admitting: Internal Medicine

## 2022-07-07 ENCOUNTER — Ambulatory Visit: Payer: BLUE CROSS/BLUE SHIELD | Admitting: Anesthesiology

## 2022-07-07 ENCOUNTER — Encounter
Admission: RE | Disposition: A | Discharge status: Home / Self Care | Payer: Self-pay | Source: Home / Self Care | Attending: Internal Medicine

## 2022-07-07 ENCOUNTER — Other Ambulatory Visit: Payer: Self-pay

## 2022-07-07 ENCOUNTER — Ambulatory Visit
Admission: RE | Admit: 2022-07-07 | Discharge: 2022-07-07 | Disposition: A | Discharge status: Home / Self Care | Payer: BLUE CROSS/BLUE SHIELD | Attending: Internal Medicine | Admitting: Internal Medicine

## 2022-07-07 DIAGNOSIS — Z9049 Acquired absence of other specified parts of digestive tract: Secondary | ICD-10-CM | POA: Insufficient documentation

## 2022-07-07 DIAGNOSIS — K5730 Diverticulosis of large intestine without perforation or abscess without bleeding: Secondary | ICD-10-CM | POA: Insufficient documentation

## 2022-07-07 DIAGNOSIS — Z86010 Personal history of colonic polyps: Secondary | ICD-10-CM | POA: Diagnosis not present

## 2022-07-07 DIAGNOSIS — Z8546 Personal history of malignant neoplasm of prostate: Secondary | ICD-10-CM | POA: Diagnosis not present

## 2022-07-07 DIAGNOSIS — K219 Gastro-esophageal reflux disease without esophagitis: Secondary | ICD-10-CM | POA: Diagnosis not present

## 2022-07-07 DIAGNOSIS — Z09 Encounter for follow-up examination after completed treatment for conditions other than malignant neoplasm: Secondary | ICD-10-CM | POA: Diagnosis present

## 2022-07-07 DIAGNOSIS — K640 First degree hemorrhoids: Secondary | ICD-10-CM | POA: Diagnosis not present

## 2022-07-07 DIAGNOSIS — Z87891 Personal history of nicotine dependence: Secondary | ICD-10-CM | POA: Insufficient documentation

## 2022-07-07 HISTORY — PX: COLONOSCOPY: SHX5424

## 2022-07-07 SURGERY — COLONOSCOPY
Anesthesia: General

## 2022-07-07 MED ORDER — SODIUM CHLORIDE 0.9 % IV SOLN: INTRAVENOUS | Status: DC

## 2022-07-07 MED ORDER — LIDOCAINE HCL (CARDIAC) PF 100 MG/5ML IV SOSY
PREFILLED_SYRINGE | INTRAVENOUS | Status: DC | PRN
  Administered 2022-07-07: 50 mg via INTRAVENOUS

## 2022-07-07 MED ORDER — PROPOFOL 10 MG/ML IV BOLUS
INTRAVENOUS | Status: DC | PRN
  Administered 2022-07-07: 60 mg via INTRAVENOUS

## 2022-07-07 MED ORDER — PROPOFOL 500 MG/50ML IV EMUL
INTRAVENOUS | Status: DC | PRN
  Administered 2022-07-07: 150 ug/kg/min via INTRAVENOUS

## 2022-07-07 NOTE — Op Note (Signed)
[facility] [place] Gastroenterology Patient Name: Bryan Mcneil Procedure Date: 07/07/2022 12:53 PM MRN: [id number] Account #: [id number] Date of Birth: 07-10-1961 Admit Type: Outpatient Age: 61 Room: [facility] ENDO ROOM 2 Gender: Male Note Status: Finalized Instrument Name: Jasper Riling [id number] Procedure:             Colonoscopy Indications:           High risk colon cancer surveillance: Personal history                         of non-advanced adenoma Providers:             Benay Pike. Jahvon Gosline MD, MD Medicines:             Propofol per Anesthesia Complications:         No immediate complications. Procedure:             Pre-Anesthesia Assessment:                        - The risks and benefits of the procedure and the                         sedation options and risks were discussed with the                         patient. All questions were answered and informed                         consent was obtained.                        - Patient identification and proposed procedure were                         verified prior to the procedure by the nurse. The                         procedure was verified in the procedure room.                        - ASA Grade Assessment: III - A patient with severe                         systemic disease.                        - After reviewing the risks and benefits, the patient                         was deemed in satisfactory condition to undergo the                         procedure.                        After obtaining informed consent, the colonoscope was                         passed under direct vision. Throughout the procedure,  the patient's blood pressure, pulse, and oxygen                         saturations were monitored continuously. The                         Colonoscope was introduced through the anus and                         advanced to the the cecum, identified by appendiceal                          orifice and ileocecal valve. The colonoscopy was                         performed without difficulty. The patient tolerated                         the procedure well. The quality of the bowel                         preparation was good. The ileocecal valve, appendiceal                         orifice, and rectum were photographed. Findings:      The perianal and digital rectal examinations were normal. Pertinent       negatives include normal sphincter tone and no palpable rectal lesions.      Non-bleeding internal hemorrhoids were found during retroflexion. The       hemorrhoids were Grade I (internal hemorrhoids that do not prolapse).      Many small-mouthed diverticula were found in the sigmoid colon. There       was no evidence of diverticular bleeding.      The exam was otherwise without abnormality. Impression:            - Non-bleeding internal hemorrhoids.                        - Mild diverticulosis in the sigmoid colon. There was                         no evidence of diverticular bleeding.                        - The examination was otherwise normal.                        - No specimens collected. Recommendation:        - Patient has a contact number available for                         emergencies. The signs and symptoms of potential                         delayed complications were discussed with the patient.                         Return to normal activities tomorrow. Written  discharge instructions were provided to the patient.                        - Resume previous diet.                        - Continue present medications.                        - Repeat colonoscopy in 10 years for screening                         purposes.                        - Return to GI office PRN.                        - The findings and recommendations were discussed with                         the patient. Procedure Code(s):     ---  Professional ---                        X7353, Colorectal cancer screening; colonoscopy on                         individual at high risk Diagnosis Code(s):     --- Professional ---                        K57.30, Diverticulosis of large intestine without                         perforation or abscess without bleeding                        K64.0, First degree hemorrhoids                        Z86.010, Personal history of colonic polyps CPT copyright 2019 American Medical Association. All rights reserved. The codes documented in this report are preliminary and upon coder review may  be revised to meet current compliance requirements. Efrain Sella MD, MD 07/07/2022 1:16:45 PM This report has been signed electronically. Number of Addenda: 0 Note Initiated On: 07/07/2022 12:53 PM Scope Withdrawal Time: 0 hours 8 minutes 14 seconds  Total Procedure Duration: 0 hours 12 minutes 43 seconds  Estimated Blood Loss:  Estimated blood loss: none.      [facility]

## 2022-07-07 NOTE — Anesthesia Procedure Notes (Signed)
Procedure Name: MAC Date/Time: 07/07/2022 12:50 PM  Performed by: Letitia Neri, CRNAPre-anesthesia Checklist: Patient identified, Emergency Drugs available, Suction available and Patient being monitored Patient Re-evaluated:Patient Re-evaluated prior to induction Oxygen Delivery Method: Nasal cannula

## 2022-07-07 NOTE — Anesthesia Preprocedure Evaluation (Signed)
Anesthesia Evaluation  Patient identified by MRN, date of birth, ID band Patient awake    Reviewed: Allergy & Precautions, H&P , NPO status , Patient's Chart, lab work & pertinent test results, reviewed documented beta blocker date and time   Airway Mallampati: II   Neck ROM: full    Dental  (+) Poor Dentition   Pulmonary neg pulmonary ROS, former smoker,    Pulmonary exam normal        Cardiovascular negative cardio ROS Normal cardiovascular exam Rhythm:regular Rate:Normal     Neuro/Psych negative neurological ROS  negative psych ROS   GI/Hepatic negative GI ROS, Neg liver ROS,   Endo/Other  negative endocrine ROS  Renal/GU negative Renal ROS  negative genitourinary   Musculoskeletal   Abdominal   Peds  Hematology negative hematology ROS (+)   Anesthesia Other Findings Past Medical History: No date: BPH (benign prostatic hyperplasia) No date: Elevated PSA No date: Erectile dysfunction No date: Hypogonadism in male No date: IGT (impaired glucose tolerance) No date: Prostate cancer (HCC) No date: Serum total bilirubin elevated No date: Testicular pain, left No date: Weak urinary stream Past Surgical History: 2006: BACK SURGERY No date: CHOLECYSTECTOMY 03/31/2018: COLONOSCOPY WITH PROPOFOL; N/A     Comment:  Procedure: COLONOSCOPY WITH PROPOFOL;  Surgeon: Manya Silvas, MD;  Location: [facility] ENDOSCOPY;  Service:               Endoscopy;  Laterality: N/A; 2002: GALLBLADDER SURGERY No date: Metal plate in neck   Reproductive/Obstetrics negative OB ROS                             Anesthesia Physical Anesthesia Plan  ASA: 2  Anesthesia Plan: General   Post-op Pain Management:    Induction:   PONV Risk Score and Plan:   Airway Management Planned:   Additional Equipment:   Intra-op Plan:   Post-operative Plan:   Informed Consent: I have reviewed the  patients History and Physical, chart, labs and discussed the procedure including the risks, benefits and alternatives for the proposed anesthesia with the patient or authorized representative who has indicated his/her understanding and acceptance.     Dental Advisory Given  Plan Discussed with: CRNA  Anesthesia Plan Comments:         Anesthesia Quick Evaluation

## 2022-07-07 NOTE — Transfer of Care (Signed)
Immediate Anesthesia Transfer of Care Note  Patient: Bryan Mcneil  Procedure(s) Performed: COLONOSCOPY  Patient Location: PACU  Anesthesia Type:General  Level of Consciousness: sedated  Airway & Oxygen Therapy: Patient Spontanous Breathing  Post-op Assessment: Report given to RN and Post -op Vital signs reviewed and stable  Post vital signs: Reviewed and stable  Last Vitals:  Vitals Value Taken Time  BP 98/65 07/07/22 1316  Temp 36.4 C 07/07/22 1315  Pulse 65 07/07/22 1316  Resp 16 07/07/22 1316  SpO2 99 % 07/07/22 1316  Vitals shown include unvalidated device data.  Last Pain:  Vitals:   07/07/22 1315  TempSrc: Temporal  PainSc: Asleep         Complications: No notable events documented.

## 2022-07-07 NOTE — Interval H&P Note (Signed)
History and Physical Interval Note:  07/07/2022 12:43 PM  Bryan Mcneil  has presented today for surgery, with the diagnosis of Hx of adenomatous polyp of colon (Z86.010).  The various methods of treatment have been discussed with the patient and family. After consideration of risks, benefits and other options for treatment, the patient has consented to  Procedure(s) with comments: COLONOSCOPY (N/A) - SPANISH INTERPRETER as a surgical intervention.  The patient's history has been reviewed, patient examined, no change in status, stable for surgery.  I have reviewed the patient's chart and labs.  Questions were answered to the patient's satisfaction.     [place], [place]

## 2022-07-07 NOTE — H&P (Signed)
Outpatient short stay form Pre-procedure 07/07/2022 12:41 PM Kemora Pinard K. Alice Reichert, M.D.  Primary Physician: [facility]  Reason for visit:  Personal history of colon polyps  History of present illness:  Mr. Bryan Mcneil presents to the [facility] GI clinic for chief complaint of high-risk colon cancer surveillance 2/2 personal history of adenomatous polyp of colon. He is non-English speaking and [place] is used Linna Hoff, [phone]). He had screening colonoscopy performed July 2019 which showed one small tubular adenoma removed from transverse colon and otherwise normal examined colon. He denies any known family history of colorectal cancer or advanced adenomas. He denies any acute GI complaints today. He reports his bowel habits are regular. He has 3-4 formed bowel movements daily. This has been his normal since having his gallbladder removed ~2004. He denies any issues with overt diarrhea, hematochezia, melena, or fecal incontinence. If he eats dairy products he can have some urgency and bloating so he tries to limit this. He denies any abdominal pain or abdominal cramping. Appetite and diet are stable without any unintentional weight loss. He has occasional heartburn and reflux symptoms and takes OTC anti-reflux medication and these are well-controlled. No nocturnal awakening episodes. He denies any issues with esophageal dysphagia, odynophagia, early satiety, nausea, vomiting, or epigastric abdominal pain. He offers no other specific complaints or concerns today.      Current Facility-Administered Medications:    0.9 %  sodium chloride infusion, , Intravenous, Continuous, Soriyah Osberg, Benay Pike, MD  Medications Prior to Admission  Medication Sig Dispense Refill Last Dose   calcium gluconate 500 MG tablet Take 1 tablet by mouth 3 (three) times daily. (Patient not taking: Reported on 07/07/2022)   Not Taking   meloxicam (MOBIC) 15 MG tablet Take 15 mg by mouth daily. (Patient not taking: Reported on 07/07/2022)    Not Taking   sildenafil (REVATIO) 20 MG tablet Take 1-5 tablets (20-100 mg total) by mouth daily as needed. (Patient not taking: Reported on 07/07/2022) 90 tablet 3 Not Taking   tamsulosin (FLOMAX) 0.4 MG CAPS capsule TAKE 1 CAPSULE(0.4 MG) BY MOUTH DAILY (Patient not taking: Reported on 07/07/2022) 90 capsule 3 Not Taking     Allergies  Allergen Reactions   Vicodin [Hydrocodone-Acetaminophen] Shortness Of Breath     Past Medical History:  Diagnosis Date   BPH (benign prostatic hyperplasia)    Elevated PSA    Erectile dysfunction    Hypogonadism in male    IGT (impaired glucose tolerance)    Prostate cancer (HCC)    Serum total bilirubin elevated    Testicular pain, left    Weak urinary stream     Review of systems:  Otherwise negative.    Physical Exam  Gen: Alert, oriented. Appears stated age.  HEENT: [place]/AT. PERRLA. Lungs: CTA, no wheezes. CV: RR nl S1, S2. Abd: soft, benign, no masses. BS+ Ext: No edema. Pulses 2+    Planned procedures: Proceed with colonoscopy. The patient understands the nature of the planned procedure, indications, risks, alternatives and potential complications including but not limited to bleeding, infection, perforation, damage to internal organs and possible oversedation/side effects from anesthesia. The patient agrees and gives consent to proceed.  Please refer to procedure notes for findings, recommendations and patient disposition/instructions.     Kadra Kohan K. Alice Reichert, M.D. Gastroenterology 07/07/2022  12:41 PM

## 2022-07-08 ENCOUNTER — Encounter: Payer: Self-pay | Admitting: Internal Medicine

## 2022-07-08 NOTE — Anesthesia Postprocedure Evaluation (Signed)
Anesthesia Post Note  Patient: Bryan Mcneil  Procedure(s) Performed: COLONOSCOPY  Patient location during evaluation: PACU Anesthesia Type: General Level of consciousness: awake and alert Pain management: pain level controlled Vital Signs Assessment: post-procedure vital signs reviewed and stable Respiratory status: spontaneous breathing, nonlabored ventilation, respiratory function stable and patient connected to nasal cannula oxygen Cardiovascular status: blood pressure returned to baseline and stable Postop Assessment: no apparent nausea or vomiting Anesthetic complications: no   No notable events documented.   Last Vitals:  Vitals:   07/07/22 1325 07/07/22 1335  BP: 105/78 110/84  Pulse: 72 62  Resp: 16   Temp:    SpO2: 100% 100%    Last Pain:  Vitals:   07/08/22 0745  TempSrc:   PainSc: 0-No pain                 Molli Barrows

## 2022-09-08 ENCOUNTER — Other Ambulatory Visit: Payer: BLUE CROSS/BLUE SHIELD

## 2022-09-08 DIAGNOSIS — N401 Enlarged prostate with lower urinary tract symptoms: Secondary | ICD-10-CM

## 2022-09-09 LAB — PSA: Prostate Specific Ag, Serum: 7.4 ng/mL — ABNORMAL HIGH (ref 0.0–4.0)

## 2022-09-10 ENCOUNTER — Other Ambulatory Visit: Payer: Commercial Managed Care - PPO

## 2022-09-16 ENCOUNTER — Ambulatory Visit: Payer: BLUE CROSS/BLUE SHIELD | Admitting: Urology

## 2022-09-16 ENCOUNTER — Encounter: Payer: Self-pay | Admitting: Urology

## 2022-09-16 VITALS — BP 127/78 | HR 67 | Ht 67.0 in | Wt 183.4 lb

## 2022-09-16 DIAGNOSIS — N401 Enlarged prostate with lower urinary tract symptoms: Secondary | ICD-10-CM | POA: Diagnosis not present

## 2022-09-16 DIAGNOSIS — N138 Other obstructive and reflux uropathy: Secondary | ICD-10-CM

## 2022-09-16 DIAGNOSIS — C61 Malignant neoplasm of prostate: Secondary | ICD-10-CM

## 2022-09-16 DIAGNOSIS — N529 Male erectile dysfunction, unspecified: Secondary | ICD-10-CM | POA: Diagnosis not present

## 2022-09-16 LAB — BLADDER SCAN AMB NON-IMAGING

## 2022-09-16 NOTE — Progress Notes (Signed)
   09/16/2022 1:53 PM   Jew[place] Maxi Rodas 09-23-1960 [id number]  Reason for visit: Follow up low risk prostate cancer, ED, BPH  HPI: I saw Mr. Bryan Mcneil in urology clinic for the above issues.  Today's visit conducted with Patent attorney.  He is a healthy 61 year old male with a family history of lethal prostate cancer in his father in his mid 103. He has a history of a negative prostate biopsy in April 2017 for an elevated PSA of 7.2. PSA continued to rise and was 9.2 and he underwent a prostate MRI that showed a 110 g prostate with a PI-RADS 3 lesion in the right mid/apical gland measuring 1.3 cm. He underwent a fusion biopsy 10/2019 at [facility] urology with Dr. 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 PSA density is reassuring at 0.08.   He opted for active surveillance, and returns today for PSA.  PSA is stable at 7.4 from 7.4 last year, and stable long-term with PSA of 7.4 in 2019.  He denies any worsening of his urinary symptoms, and continues on Flomax.  He denies any gross hematuria, weight loss, or bone pain.  Really does not have any significant urinary complaints today, is no longer having significant nocturia since starting Flomax, and feels like he empties well.  PVR is normal today at 6 mL.   Regarding his erectile dysfunction, he continues on 1-3 tabs of sildenafil as needed.  This helps him significantly with erections.   We had another conversation about his very low risk prostate cancer, and AUA and NCCN recommendations regarding active surveillance is the primary treatment strategy for patients with low risk disease.  He opted to defer confirmatory biopsy, as he had significant amount of pain with his initial fusion biopsy.  With his reassuring PSA density, single core of very small core involvement, and stable PSA, I think this is reasonable.  We discussed need to  consider repeat prostate biopsy or repeat MRI if PSA trended up.  -Flomax and sildenafil refilled -RTC 1 year PSA prior for surveillance of very low risk prostate cancer, PVR   Billey Co, MD  [facility] [address], [place], [place] [address] [phone]

## 2023-09-16 ENCOUNTER — Other Ambulatory Visit: Payer: BLUE CROSS/BLUE SHIELD

## 2023-09-16 DIAGNOSIS — C61 Malignant neoplasm of prostate: Secondary | ICD-10-CM

## 2023-09-17 LAB — PSA: Prostate Specific Ag, Serum: 13.1 ng/mL — ABNORMAL HIGH (ref 0.0–4.0)

## 2023-09-22 ENCOUNTER — Ambulatory Visit: Payer: BLUE CROSS/BLUE SHIELD | Admitting: Urology

## 2023-09-22 ENCOUNTER — Encounter: Payer: Self-pay | Admitting: Urology

## 2023-09-22 VITALS — BP 143/74 | HR 79 | Ht 67.0 in | Wt 184.0 lb

## 2023-09-22 DIAGNOSIS — C61 Malignant neoplasm of prostate: Secondary | ICD-10-CM | POA: Diagnosis not present

## 2023-09-22 DIAGNOSIS — N529 Male erectile dysfunction, unspecified: Secondary | ICD-10-CM | POA: Diagnosis not present

## 2023-09-22 DIAGNOSIS — N138 Other obstructive and reflux uropathy: Secondary | ICD-10-CM

## 2023-09-22 DIAGNOSIS — N401 Enlarged prostate with lower urinary tract symptoms: Secondary | ICD-10-CM | POA: Diagnosis not present

## 2023-09-22 MED ORDER — TAMSULOSIN HCL 0.4 MG PO CAPS: 0.4000 mg | ORAL_CAPSULE | Freq: Every day | ORAL | 3 refills | Status: DC

## 2023-09-22 MED ORDER — SILDENAFIL CITRATE 20 MG PO TABS: 20.0000 mg | ORAL_TABLET | Freq: Every day | ORAL | 3 refills | Status: DC | PRN

## 2023-09-22 NOTE — Progress Notes (Signed)
   09/22/2023 2:56 PM   Bryan Mcneil 1961/01/26 [id number]  Reason for visit: Follow up low risk prostate cancer, ED, BPH  HPI: oday's visit conducted with Spanish translator.  He is a healthy 63 year old male with a family history of lethal prostate cancer in his father in his mid sixties. He has a history of a negative prostate biopsy in April 2017 for an elevated PSA of 7.2. PSA continued to rise and was 9.2 and he underwent a prostate MRI that showed a 110 g prostate with a PI-RADS 3 lesion in the right mid/apical gland measuring 1.3 cm. He underwent a fusion biopsy February 2021 at [facility] u[facility] with Dr. 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 PSA density is reassuring at 0.08.   He has been on active surveillance.  PSA had been stable from 2019 -2023 ranging from 7.0-9.2.  Most recent PSA from December 2024 increased to 13.1.  We discussed possible causes of a false elevation, versus progression of his disease.  He is fairly adamant he would like to avoid repeat biopsy, as he had significant discomfort from that procedure.  Using shared decision making, he opted for repeat PSA in the next few weeks, and if remains elevated would be amenable to a repeat prostate MRI.  He had been on Flomax  for weak stream and nocturia, however he discontinued that medication about 6 months ago for unclear reasons.  PVR today is normal at 2ml, but he reports worsening urinary frequency and nocturia since stopping the medication.  Will refill Flomax .   Regarding his erectile dysfunction, he continues on 1-3 tabs of sildenafil  as needed.  This helps him significantly with erections.   -Restart Flomax  -Sildenafil  refilled -Repeat PSA reflex to free in 4 to 6 weeks-> if downtrending can continue yearly monitoring, however if remains significantly elevated would recommend repeat prostate  MRI   Redell JAYSON Burnet, MD  [facility] [address] [place], [place] [address] [phone]

## 2023-10-20 ENCOUNTER — Other Ambulatory Visit: Payer: BLUE CROSS/BLUE SHIELD

## 2023-10-20 DIAGNOSIS — C61 Malignant neoplasm of prostate: Secondary | ICD-10-CM

## 2023-10-21 LAB — PSA, TOTAL AND FREE
PSA, Free Pct: 19.8 %
PSA, Free: 1.82 ng/mL
Prostate Specific Ag, Serum: 9.2 ng/mL — ABNORMAL HIGH (ref 0.0–4.0)

## 2023-10-21 NOTE — Telephone Encounter (Signed)
 Appointment scheduled.

## 2024-05-10 ENCOUNTER — Encounter: Payer: Self-pay | Admitting: Urology

## 2024-10-04 ENCOUNTER — Other Ambulatory Visit: Payer: Self-pay

## 2024-10-04 DIAGNOSIS — C61 Malignant neoplasm of prostate: Secondary | ICD-10-CM

## 2024-10-09 ENCOUNTER — Other Ambulatory Visit: Payer: Self-pay

## 2024-10-09 DIAGNOSIS — N138 Other obstructive and reflux uropathy: Secondary | ICD-10-CM

## 2024-10-09 MED ORDER — TAMSULOSIN HCL 0.4 MG PO CAPS: 0.4000 mg | ORAL_CAPSULE | Freq: Every day | ORAL | 3 refills | Status: DC

## 2024-10-15 ENCOUNTER — Other Ambulatory Visit

## 2024-10-16 ENCOUNTER — Other Ambulatory Visit

## 2024-10-16 DIAGNOSIS — C61 Malignant neoplasm of prostate: Secondary | ICD-10-CM

## 2024-10-17 ENCOUNTER — Ambulatory Visit

## 2024-10-17 VITALS — BP 139/74 | HR 77

## 2024-10-17 DIAGNOSIS — N529 Male erectile dysfunction, unspecified: Secondary | ICD-10-CM | POA: Diagnosis not present

## 2024-10-17 DIAGNOSIS — C61 Malignant neoplasm of prostate: Secondary | ICD-10-CM

## 2024-10-17 DIAGNOSIS — N401 Enlarged prostate with lower urinary tract symptoms: Secondary | ICD-10-CM | POA: Diagnosis not present

## 2024-10-17 DIAGNOSIS — N50812 Left testicular pain: Secondary | ICD-10-CM

## 2024-10-17 DIAGNOSIS — N138 Other obstructive and reflux uropathy: Secondary | ICD-10-CM | POA: Diagnosis not present

## 2024-10-17 LAB — PSA: Prostate Specific Ag, Serum: 9.5 ng/mL — ABNORMAL HIGH (ref 0.0–4.0)

## 2024-10-17 MED ORDER — SILDENAFIL CITRATE 20 MG PO TABS: 20.0000 mg | ORAL_TABLET | Freq: Every day | ORAL | 3 refills | Status: AC | PRN

## 2024-10-17 MED ORDER — TAMSULOSIN HCL 0.4 MG PO CAPS: 0.4000 mg | ORAL_CAPSULE | Freq: Every day | ORAL | 3 refills | Status: AC

## 2024-10-17 NOTE — Progress Notes (Signed)
 "  10/17/2024 1:56 PM   Bryan Mcneil October 07, 1960 [id number]  CC: Chief Complaint  Patient presents with   Testicle Pain   Elevated PSA    HPI: Bryan Mcneil is a 64 y.o. male who presents to clinic today for regular yearly follow up for prostate cancer, ED, and BPH.   Today's visit was conducted with a Spanish interpreter. He has been on active surveillance. Most recent PSA was stable at 9.5. Prior PSA on 10/20/23 was 9.2.  Since his last visit he restarted Flomax  for his weak stream and nocturia. Reports symptoms have significantly improved. States he now only has 1 nighttime awakening on a nightly basis. He is taking medication as prescribed. Does not have any current urinary symptoms.   Erectile dysfunction is currently well managed with sildenafil  PRN. Denies headaches, vision changes, or nasal congestion.  He reports left testicular pain. Pain began 8 years ago. Pain is triggered with penetration during sexual intercourse. States the pain feels like someone is hitting him in the testicle. Pain relieved shortly after sexual intercourse. No alleviating factors. He had an ultrasound of bilateral testicles on 08/16/14 and was read as a normal scrotal ultrasound. He denies scrotal redness or swelling.  PSA 10/16/24: 9.5  PMH: Past Medical History:  Diagnosis Date   BPH (benign prostatic hyperplasia)    Elevated PSA    Erectile dysfunction    Hypogonadism in male    IGT (impaired glucose tolerance)    Prostate cancer (HCC)    Serum total bilirubin elevated    Testicular pain, left    Weak urinary stream     Surgical History: Past Surgical History:  Procedure Laterality Date   BACK SURGERY  2006   CHOLECYSTECTOMY     COLONOSCOPY N/A 07/07/2022   Procedure: COLONOSCOPY;  Surgeon: Toledo, Ladell POUR, MD;  Location: [facility] ENDOSCOPY;  Service: Gastroenterology;  Laterality: N/A;  SPANISH INTERPRETER   COLONOSCOPY WITH PROPOFOL  N/A 03/31/2018   Procedure:  COLONOSCOPY WITH PROPOFOL ;  Surgeon: Viktoria Lamar DASEN, MD;  Location: [facility] ENDOSCOPY;  Service: Endoscopy;  Laterality: N/A;   GALLBLADDER SURGERY  2002   Metal plate in neck      Home Medications:  Allergies as of 10/17/2024       Reactions   Vicodin [hydrocodone-acetaminophen] Shortness Of Breath        Medication List        Accurate as of October 17, 2024  1:56 PM. If you have any questions, ask your nurse or doctor.          sildenafil  20 MG tablet Commonly known as: Revatio  Take 1-5 tablets (20-100 mg total) by mouth daily as needed.   tamsulosin  0.4 MG Caps capsule Commonly known as: FLOMAX  Take 1 capsule (0.4 mg total) by mouth daily after supper.        Allergies:  Allergies[1]  Family History: Family History  Problem Relation Age of Onset   Prostate cancer Father    Bladder Cancer Neg Hx    Kidney disease Neg Hx     Social History:   reports that he quit smoking about 14 years ago. His smoking use included cigarettes. He has never used smokeless tobacco. He reports current alcohol use. He reports that he does not use drugs.  Physical Exam: BP 139/74 (BP Location: Left Arm, Patient Position: Sitting, Cuff Size: Normal)   Pulse 77   SpO2 96%   Constitutional:  Alert and oriented, no acute distress, nontoxic appearing HEENT: Nectar, AT  Cardiovascular: No clubbing, cyanosis, or edema Respiratory: Normal respiratory effort, no increased work of breathing GI: Abdomen is soft, nontender, nondistended, no abdominal masses GU: No CVA tenderness Skin: No rashes, bruises or suspicious lesions Neurologic: Grossly intact, no focal deficits, moving all 4 extremities Psychiatric: Normal mood and affect  Laboratory Data: Lab Results  Component Value Date   WBC 5.1 08/01/2012   HGB 13.7 08/01/2012   HCT 40.2 08/01/2012   MCV 89 08/01/2012   PLT 204 08/01/2012    Lab Results  Component Value Date   CREATININE 0.69 08/01/2012    CrCl cannot be  calculated (Patient's most recent lab result is older than the maximum 21 days allowed.).  Results for orders placed or performed in visit on 10/16/24  PSA   Collection Time: 10/16/24 10:51 AM  Result Value Ref Range   Prostate Specific Ag, Serum 9.5 (H) 0.0 - 4.0 ng/mL    Pertinent Imaging: US  Testicular W/Doppler Complete: 08/16/14 IMPRESSION:  Normal scrotal ultrasound examination.   I personally reviewed the images referenced above.  Assessment & Plan:   Bryan Mcneil is a 64 y.o. male who presents to clinic today for regular yearly follow up for prostate cancer, ED, and BPH. PSA remains stable. We will continue yearly monitoring. As for ongoing left testicular pain, I suspect symptoms may be due to pelvic floor dysfunction. Will place a referral for pelvic floor PT.   1. Prostate cancer (HCC) (Primary) - PSA 10/16/24 9.5, at baseline - Can continue yearly monitoring - Follow up in 1 year with PSA prior  2. BPH with obstruction/lower urinary tract symptoms - Continue Flomax   3. Erectile Dysfunction - Sildenafil  refilled  4. Testicular Pain - Reports an 8-year history of left testicular pain. - Discussed repeat imaging, patient does not want a repeat ultrasound at this time. Would prefer watchful waiting. Discussed returning to clinic if symptoms worsen. - Ambulatory referral to PT placed.  Return in about 1 year (around 10/17/2025).  Marry MALVA Sara, PA-C  [facility] [address] [place], [place] [address] [phone]        [1]  Allergies Allergen Reactions   Vicodin [Hydrocodone-Acetaminophen] Shortness Of Breath   "

## 2024-10-18 ENCOUNTER — Ambulatory Visit: Payer: BLUE CROSS/BLUE SHIELD | Admitting: Urology

## 2025-10-17 ENCOUNTER — Other Ambulatory Visit

## 2025-10-22 ENCOUNTER — Ambulatory Visit: Admitting: Urology
